# Patient Record
Sex: Female | Born: 1955 | Race: White | Hispanic: No | Smoking: Never smoker
Health system: Southern US, Community
[De-identification: ages and names within clinical notes are randomized; demographics above are authoritative.]

## PROBLEM LIST (undated history)

## (undated) DIAGNOSIS — R112 Nausea with vomiting, unspecified: Secondary | ICD-10-CM

## (undated) DIAGNOSIS — I10 Essential (primary) hypertension: Secondary | ICD-10-CM

## (undated) DIAGNOSIS — Z9889 Other specified postprocedural states: Secondary | ICD-10-CM

## (undated) HISTORY — DX: Essential (primary) hypertension: I10

## (undated) HISTORY — PX: ABDOMINAL HYSTERECTOMY: SHX81

## (undated) HISTORY — PX: SHOULDER SURGERY: SHX246

---

## 1980-07-08 HISTORY — PX: TUBAL LIGATION: SHX77

## 1997-10-06 ENCOUNTER — Other Ambulatory Visit: Admission: RE | Admit: 1997-10-06 | Discharge: 1997-10-06 | Payer: Self-pay | Admitting: Obstetrics and Gynecology

## 1998-10-11 ENCOUNTER — Other Ambulatory Visit: Admission: RE | Admit: 1998-10-11 | Discharge: 1998-10-11 | Payer: Self-pay | Admitting: Obstetrics and Gynecology

## 2000-02-01 ENCOUNTER — Other Ambulatory Visit: Admission: RE | Admit: 2000-02-01 | Discharge: 2000-02-01 | Payer: Self-pay | Admitting: Obstetrics and Gynecology

## 2002-06-15 ENCOUNTER — Other Ambulatory Visit: Admission: RE | Admit: 2002-06-15 | Discharge: 2002-06-15 | Payer: Self-pay | Admitting: Family Medicine

## 2006-06-06 ENCOUNTER — Encounter (INDEPENDENT_AMBULATORY_CARE_PROVIDER_SITE_OTHER): Payer: Self-pay | Admitting: Specialist

## 2006-06-06 ENCOUNTER — Ambulatory Visit (HOSPITAL_BASED_OUTPATIENT_CLINIC_OR_DEPARTMENT_OTHER): Admission: RE | Admit: 2006-06-06 | Discharge: 2006-06-06 | Payer: Self-pay | Admitting: Otolaryngology

## 2006-07-15 ENCOUNTER — Encounter: Admission: RE | Admit: 2006-07-15 | Discharge: 2006-07-15 | Payer: Self-pay | Admitting: Otolaryngology

## 2007-02-17 ENCOUNTER — Ambulatory Visit (HOSPITAL_COMMUNITY): Admission: RE | Admit: 2007-02-17 | Discharge: 2007-02-18 | Payer: Self-pay | Admitting: Orthopedic Surgery

## 2007-02-27 ENCOUNTER — Encounter: Admission: RE | Admit: 2007-02-27 | Discharge: 2007-04-29 | Payer: Self-pay | Admitting: Orthopedic Surgery

## 2010-02-06 ENCOUNTER — Encounter: Payer: Self-pay | Admitting: Cardiovascular Disease

## 2010-11-13 ENCOUNTER — Encounter: Payer: Self-pay | Admitting: Cardiovascular Disease

## 2010-11-20 NOTE — Op Note (Signed)
NAMEJACQUILYN, Dana Gray                ACCOUNT NO.:  [id number]   MEDICAL RECORD NO.:  [id number]          PATIENT TYPE:  OIB   LOCATION:  5727                         FACILITY:  [facility]   PHYSICIAN:  Burnard Bunting, M.D.    DATE OF BIRTH:  07-18-1955   DATE OF PROCEDURE:  02/17/2007  DATE OF DISCHARGE:  02/18/2007                               OPERATIVE REPORT   PREOPERATIVE DIAGNOSIS:  Left shoulder rotator cuff tear and bursitis.   POSTOPERATIVE DIAGNOSIS:  Left shoulder rotator cuff tear and bursitis.   PROCEDURE:  Left shoulder diagnostic arthroscopy with arthroscopic  subacromial decompression and mini open rotator cuff repair.   SURGEON:  Burnard Bunting, M.D.   ASSISTANT:  Jerolyn Shin. Tresa Res, M.D.   ANESTHESIA:  General endotracheal.   ESTIMATED BLOOD LOSS:  Minimal.   INDICATIONS:  Dana Gray is a 55 year old patient with a symptomatic  left shoulder rotator cuff tear who presents now for operative  management after failure of conservative management and because of her  young age.   OPERATIVE FINDINGS:  1. Examination under anesthesia range of motion 180 forward flexion,      external rotation 50 degrees, abduction is about 70.  She has      excellent shoulder stability with less than 1 cm sulcus sign.  2. Diagnostic arthroscopy:      a.     A 1 x 2 cm rotator cuff tear of the supraspinatus which was       minimally retracted.      b.     Intact biceps anchor.      c.     Stable glenohumeral articular surface.      d.     Significant subacromial bursitis.   PROCEDURE IN DETAIL:  The patient was brought to the operating room  where general endotracheal anesthesia was induced.  Appropriate  antibiotics were administered.  The patient was placed in the beach  chair position with head in neutral position.  Left arm, hand and  shoulder were prepped with DuraPrep solution and draped in sterile  manner.  Topographic anatomy of the shoulder was identified including  the  posterolateral and anterior margins of the acromion as well as the  coracoid process.  A solution of saline and epinephrine was injected  into the subacromial space.  A posterior portal was created 2 cm medial  and inferior to the posterolateral margin of the acromion.  Diagnostic  arthroscopy was performed.  Anterior portal was created for  visualization.  Biceps anchor was intact.  Glenohumeral articular  surface was intact.  The rotator cuff tear was identified on the leading  edge of supraspinatus.  Subacromial bursitis was present.  A lateral  portal was created.  Subacromial decompression was performed with relief  but not resection of the same ligament.   At this time anterior and posterior portals were closed with 3-0 nylon  suture.  The lateral incision was extended distally and proximally for a  distance of 4 cm.  A deltoid split was made and measured a distance of 4  cm  from the inferior margin of the acromion.  A stay suture was placed.  Self-retaining retractor was placed.  Rotator cuff tear was identified,  mobilized and secured to a prepared native footprint on the greater  tuberosity using 2 corkscrew anchors and 2 push-locks.  A watertight  repair was achieved.  The arm was taken through range of motion and  found to have no impingement.  Incision was irrigated.  Deltoid was  closed using 0 Vicryl suture.  Skin was closed using interrupted 2-0  Vicryl suture, running 3-0 polypropylene.  Patient tolerated the  procedure well without immediate complications.  A bulky dressing and  shoulder immobilizer was placed.  The assistance of Dr. Tresa Res was  required at all times during the case for retraction of neurovascular  structures.  Assistance was a medical necessity for this case.      Burnard Bunting, M.D.  Electronically Signed     GSD/MEDQ  D:  03/03/2007  T:  03/04/2007  Job:  [id number]

## 2011-03-06 ENCOUNTER — Encounter: Payer: Self-pay | Admitting: *Deleted

## 2011-03-07 ENCOUNTER — Encounter: Payer: Self-pay | Admitting: Cardiovascular Disease

## 2011-03-07 ENCOUNTER — Ambulatory Visit (INDEPENDENT_AMBULATORY_CARE_PROVIDER_SITE_OTHER): Payer: BC Managed Care – PPO | Admitting: Cardiovascular Disease

## 2011-03-07 DIAGNOSIS — I10 Essential (primary) hypertension: Secondary | ICD-10-CM | POA: Insufficient documentation

## 2011-03-07 DIAGNOSIS — R079 Chest pain, unspecified: Secondary | ICD-10-CM | POA: Insufficient documentation

## 2011-03-07 LAB — BASIC METABOLIC PANEL
BUN: 12 mg/dL (ref 6–23)
CO2: 28 mEq/L (ref 19–32)
Calcium: 9.2 mg/dL (ref 8.4–10.5)
Chloride: 104 mEq/L (ref 96–112)
Creatinine, Ser: 0.7 mg/dL (ref 0.4–1.2)
GFR: 93.05 mL/min (ref >60.00)
Glucose, Bld: 97 mg/dL (ref 70–99)
Potassium: 4.2 mEq/L (ref 3.5–5.1)
Sodium: 140 mEq/L (ref 135–145)

## 2011-03-07 LAB — SEDIMENTATION RATE: Sed Rate: 22 mm/hr (ref 0–22)

## 2011-03-07 MED ORDER — TRAMADOL HCL 50 MG PO TBDP: 1.0000 | ORAL_TABLET | Freq: Every day | ORAL | Status: DC

## 2011-03-07 NOTE — Patient Instructions (Signed)
Your physician recommends that you schedule a follow-up appointment in: AFTER TESTING  Your physician recommends that you return for lab work in: TODAY  START TRAMADOL/ULTRAM 50 MG ONCE DAILY

## 2011-03-07 NOTE — Assessment & Plan Note (Signed)
Well controlled.  Continue current medications and low sodium Dash type diet.    

## 2011-03-07 NOTE — Assessment & Plan Note (Signed)
Atypical Trial of Tramadol.  ESR/ANA/RF  Randomize in [facility] to CT or stress echo

## 2011-03-07 NOTE — Progress Notes (Signed)
55 yo referred by Dr Christell Constant for SSCP.  Normal ETT last October.  Pain persists.  Not helped by gi meds.  Not always exertional.  Can last seconds or minutes.  Usually sharp and left sided.  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  No associated palpitations, dyspnea, diaphoresis.  Has arthritis in hips but no where else and no connective tissue disease.  Pain is not pleuritic.  No wheezing or respitory symptoms.    Good patient to be randomized in [facility] to CT vs stress echo.  Patient willing to have Cr checked.  CT would evaluate the entire chest as her pain seems muscular.  Will give her a trial of Tramadol for 3 weeks also   ROS: Denies fever, malais, weight loss, blurry vision, decreased visual acuity, cough, sputum, SOB, hemoptysis, pleuritic pain, palpitaitons, heartburn, abdominal pain, melena, lower extremity edema, claudication, or rash.  All other systems reviewed and negative   General: Affect appropriate Healthy:  appears stated age HEENT: normal Neck supple with no adenopathy JVP normal no bruits no thyromegaly Lungs clear with no wheezing and good diaphragmatic motion Heart:  S1/S2 no murmur,rub, gallop or click PMI normal Abdomen: benighn, BS positve, no tenderness, no AAA no bruit.  No HSM or HJR Distal pulses intact with no bruits No edema Neuro non-focal Skin warm and dry No muscular weakness  Medications Current Outpatient Prescriptions  Medication Sig Dispense Refill  . cetirizine (ZYRTEC) 10 MG tablet Take 10 mg by mouth daily.        Marland Kitchen lisinopril (PRINIVIL,ZESTRIL) 10 MG tablet Take 10 mg by mouth daily.        Marland Kitchen omeprazole (PRILOSEC) 20 MG capsule Take 20 mg by mouth daily.          Allergies Review of patient's allergies indicates not on file.  Family History: No family history on file.  Social History: History   Social History  . Marital Status: Married    Spouse Name: N/A    Number of Children: N/A  . Years of Education: N/A    Occupational History  . Not on file.   Social History Main Topics  . Smoking status: Never Smoker   . Smokeless tobacco: Not on file  . Alcohol Use: Not on file  . Drug Use: Not on file  . Sexually Active: Not on file   Other Topics Concern  . Not on file   Social History Narrative  . No narrative on file    Electrocardiogram:  NSR minimal voltage for LVHrate 77  Assessment and Plan

## 2011-03-08 ENCOUNTER — Telehealth: Payer: Self-pay | Admitting: Cardiovascular Disease

## 2011-03-08 ENCOUNTER — Telehealth: Payer: Self-pay | Admitting: *Deleted

## 2011-03-08 NOTE — Telephone Encounter (Signed)
I spoke to patient and told her she was randomized to Coronary CTA for the [facility] study.I let her know BMET lab was normal.I told patient she would receive phone call form Merita Norton in the office with the date of CTA. Patient stated Tuesday is the best day with her work schedule. Patient does not want to be scheduled on Sept 13 and 14 due to new grand baby due

## 2011-03-08 NOTE — Telephone Encounter (Signed)
Spoke with patient and let know the medication is Tramadol

## 2011-03-08 NOTE — Telephone Encounter (Signed)
Pt checking on med that was to be called in yesterday, not sure right med was called in

## 2011-03-12 ENCOUNTER — Other Ambulatory Visit: Payer: Self-pay | Admitting: *Deleted

## 2011-03-13 ENCOUNTER — Other Ambulatory Visit (HOSPITAL_COMMUNITY): Payer: Self-pay | Admitting: Cardiovascular Disease

## 2011-03-13 ENCOUNTER — Encounter: Payer: Self-pay | Admitting: Cardiovascular Disease

## 2011-03-20 ENCOUNTER — Ambulatory Visit (HOSPITAL_COMMUNITY)
Admission: RE | Admit: 2011-03-20 | Discharge: 2011-03-20 | Disposition: A | Discharge status: Home / Self Care | Payer: Self-pay | Source: Ambulatory Visit | Attending: Cardiovascular Disease | Admitting: Cardiovascular Disease

## 2011-03-20 DIAGNOSIS — R079 Chest pain, unspecified: Secondary | ICD-10-CM | POA: Insufficient documentation

## 2011-03-20 DIAGNOSIS — R0989 Other specified symptoms and signs involving the circulatory and respiratory systems: Secondary | ICD-10-CM

## 2011-03-20 MED ORDER — IOHEXOL 350 MG/ML SOLN
80.0000 mL | Freq: Once | INTRAVENOUS | Status: AC | PRN
  Administered 2011-03-20: 80 mL via INTRAVENOUS

## 2011-04-22 LAB — CBC
HCT: 38.5
Hemoglobin: 13
MCHC: 33.7
MCV: 85.5
Platelets: 364
RBC: 4.5
RDW: 13.4
WBC: 7.4

## 2011-04-22 LAB — BASIC METABOLIC PANEL
BUN: 9
CO2: 27
Calcium: 9.5
Chloride: 103
Creatinine, Ser: 0.7
GFR calc Af Amer: >60
GFR calc non Af Amer: >60
Glucose, Bld: 88
Potassium: 4.8
Sodium: 139

## 2012-11-20 ENCOUNTER — Ambulatory Visit (INDEPENDENT_AMBULATORY_CARE_PROVIDER_SITE_OTHER): Payer: BC Managed Care – PPO | Admitting: Nurse Practitioner

## 2012-11-20 ENCOUNTER — Encounter: Payer: Self-pay | Admitting: Nurse Practitioner

## 2012-11-20 VITALS — BP 145/84 | HR 65 | Temp 97.6°F | Ht 63.5 in | Wt 167.0 lb

## 2012-11-20 DIAGNOSIS — G4700 Insomnia, unspecified: Secondary | ICD-10-CM | POA: Insufficient documentation

## 2012-11-20 DIAGNOSIS — E785 Hyperlipidemia, unspecified: Secondary | ICD-10-CM

## 2012-11-20 DIAGNOSIS — Z0000 Encounter for general adult medical examination without abnormal findings: Secondary | ICD-10-CM

## 2012-11-20 DIAGNOSIS — M810 Age-related osteoporosis without current pathological fracture: Secondary | ICD-10-CM | POA: Insufficient documentation

## 2012-11-20 DIAGNOSIS — R5381 Other malaise: Secondary | ICD-10-CM

## 2012-11-20 LAB — COMPLETE METABOLIC PANEL WITH GFR
ALT: 18 U/L (ref 0–35)
AST: 24 U/L (ref 0–37)
Albumin: 4.8 g/dL (ref 3.5–5.2)
Alkaline Phosphatase: 88 U/L (ref 39–117)
BUN: 17 mg/dL (ref 6–23)
CO2: 29 mEq/L (ref 19–32)
Calcium: 9.6 mg/dL (ref 8.4–10.5)
Chloride: 102 mEq/L (ref 96–112)
Creat: 0.79 mg/dL (ref 0.50–1.10)
GFR, Est African American: >89 mL/min
GFR, Est Non African American: 82 mL/min
Glucose, Bld: 90 mg/dL (ref 70–99)
Potassium: 4.6 mEq/L (ref 3.5–5.3)
Sodium: 138 mEq/L (ref 135–145)
Total Bilirubin: 0.4 mg/dL (ref 0.3–1.2)
Total Protein: 7.5 g/dL (ref 6.0–8.3)

## 2012-11-20 LAB — POCT UA - MICROSCOPIC ONLY
Casts, Ur, LPF, POC: NEGATIVE
Crystals, Ur, HPF, POC: NEGATIVE
Yeast, UA: NEGATIVE

## 2012-11-20 LAB — ANEMIA PANEL 7
%SAT: 22 % (ref 20–55)
ABS Retic: 32.2 10*3/uL (ref 19.0–186.0)
Ferritin: 149 ng/mL (ref 10–291)
Folate: 15 ng/mL
HCT: 38.6 % (ref 36.0–46.0)
Hemoglobin: 12.8 g/dL (ref 12.0–15.0)
Iron: 72 ug/dL (ref 42–145)
MCH: 27.8 pg (ref 26.0–34.0)
MCHC: 33.2 g/dL (ref 30.0–36.0)
MCV: 83.9 fL (ref 78.0–100.0)
Platelets: 351 10*3/uL (ref 150–400)
RBC.: 4.6 MIL/uL (ref 3.87–5.11)
RBC: 4.6 MIL/uL (ref 3.87–5.11)
RDW: 13.8 % (ref 11.5–15.5)
Retic Ct Pct: 0.7 % (ref 0.4–2.3)
TIBC: 331 ug/dL (ref 250–470)
UIBC: 259 ug/dL (ref 125–400)
Vitamin B-12: 471 pg/mL (ref 211–911)
WBC: 8.7 10*3/uL (ref 4.0–10.5)

## 2012-11-20 LAB — POCT URINALYSIS DIPSTICK
Bilirubin, UA: NEGATIVE
Blood, UA: NEGATIVE
Glucose, UA: NEGATIVE
Ketones, UA: NEGATIVE
Leukocytes, UA: NEGATIVE
Nitrite, UA: NEGATIVE
Protein, UA: NEGATIVE
Spec Grav, UA: 1.015
Urobilinogen, UA: NEGATIVE
pH, UA: 5

## 2012-11-20 LAB — THYROID PANEL WITH TSH
Free Thyroxine Index: 3 (ref 1.0–3.9)
T3 Uptake: 31.8 % (ref 22.5–37.0)
T4, Total: 9.3 ug/dL (ref 5.0–12.5)
TSH: 2.538 u[IU]/mL (ref 0.350–4.500)

## 2012-11-20 MED ORDER — LISINOPRIL 20 MG PO TABS: 20.0000 mg | ORAL_TABLET | Freq: Every day | ORAL | Status: DC

## 2012-11-20 NOTE — Patient Instructions (Addendum)

## 2012-11-20 NOTE — Progress Notes (Signed)
  Subjective:    Patient ID: Dana Gray, female    DOB: 1955/08/13, 57 y.o.   MRN: [id number]  Hypertension This is a chronic problem. The current episode started more than 1 year ago. The problem is unchanged. The problem is uncontrolled (usually in 140's systolic). Pertinent negatives include no chest pain, headaches, neck pain, palpitations, peripheral edema, shortness of breath or sweats. There are no associated agents to hypertension. Risk factors for coronary artery disease include post-menopausal state and dyslipidemia. Past treatments include ACE inhibitors. There are no compliance problems.   Hyperlipidemia This is a chronic problem. The current episode started more than 1 year ago. The problem is controlled. Recent lipid tests were reviewed and are normal. Associated symptoms include myalgias. Pertinent negatives include no chest pain or shortness of breath. Treatments tried: patient never statred on lipitor as Rx. The current treatment provides moderate improvement of lipids. Risk factors for coronary artery disease include hypertension and post-menopausal.  insomnia Intermittent- Doesn't want to take meds for this. Fatigue Patient says she just doesn't have the energy and stamina she use to have- feels achy all over.   Review of Systems  Constitutional: Positive for fatigue.  HENT: Negative for neck pain.   Respiratory: Negative for shortness of breath.   Cardiovascular: Negative for chest pain and palpitations.  Musculoskeletal: Positive for myalgias and arthralgias.  Neurological: Negative for headaches.  Hematological: Negative.   Psychiatric/Behavioral: Negative.        Objective:   Physical Exam  Constitutional: She is oriented to person, place, and time. She appears well-developed and well-nourished.  HENT:  Nose: Nose normal.  Mouth/Throat: Oropharynx is clear and moist.  Eyes: EOM are normal.  Neck: Trachea normal, normal range of motion and full passive range of  motion without pain. Neck supple. No JVD present. Carotid bruit is not present. No thyromegaly present.  Cardiovascular: Normal rate, regular rhythm, normal heart sounds and intact distal pulses.  Exam reveals no gallop and no friction rub.   No murmur heard. Pulmonary/Chest: Effort normal and breath sounds normal.  Abdominal: Soft. Bowel sounds are normal. She exhibits no distension and no mass. There is no tenderness.  Musculoskeletal: Normal range of motion.  Lymphadenopathy:    She has no cervical adenopathy.  Neurological: She is alert and oriented to person, place, and time. She has normal reflexes.  Skin: Skin is warm and dry.  Psychiatric: She has a normal mood and affect. Her behavior is normal. Judgment and thought content normal.   BP 145/84  Pulse 65  Temp(Src) 97.6 F (36.4 C) (Oral)  Ht 5' 3.5" (1.613 m)  Wt 167 lb (75.751 kg)  BMI 29.12 kg/m2        Assessment & Plan:  1. Hyperlipidemia Low fat diet and exercise - lisinopril (PRINIVIL,ZESTRIL) 20 MG tablet; Take 1 tablet (20 mg total) by mouth daily.  Dispense: 90 tablet; Refill: 3  2. Insomnia Bedtime ritual  3. Osteoporosis Will schedule Dexa  4. Annual physical exam  - COMPLETE METABOLIC PANEL WITH GFR - NMR Lipoprofile with Lipids - Thyroid Panel With TSH  5. Other malaise and fatigue Labs pending - Anemia panel 7 - Vitamin B12  Mary-Margaret Daphine Deutscher, FNP

## 2012-11-25 ENCOUNTER — Ambulatory Visit (INDEPENDENT_AMBULATORY_CARE_PROVIDER_SITE_OTHER): Payer: BC Managed Care – PPO

## 2012-11-25 ENCOUNTER — Ambulatory Visit: Payer: BC Managed Care – PPO

## 2012-11-25 ENCOUNTER — Ambulatory Visit (INDEPENDENT_AMBULATORY_CARE_PROVIDER_SITE_OTHER): Payer: BC Managed Care – PPO | Admitting: Pharmacist

## 2012-11-25 ENCOUNTER — Other Ambulatory Visit: Payer: BC Managed Care – PPO

## 2012-11-25 VITALS — Ht 63.0 in | Wt 159.0 lb

## 2012-11-25 DIAGNOSIS — M810 Age-related osteoporosis without current pathological fracture: Secondary | ICD-10-CM

## 2012-11-25 DIAGNOSIS — Z0000 Encounter for general adult medical examination without abnormal findings: Secondary | ICD-10-CM

## 2012-11-25 NOTE — Patient Instructions (Signed)

## 2012-11-25 NOTE — Progress Notes (Signed)
Patient ID: Dana Gray, female   DOB: 05/06/1956, 57 y.o.   MRN: [id number] [facility] Current Height: Height: 5\' 3"  (160 cm)      Max Lifetime Height:  5\' 3"  Current Weight: Weight: 159 lb (72.122 kg)       Ethnicity:Caucasian   HPI: Does pt already have a diagnosis of:  Osteoporosis?  Yes  Back Pain?  No       Kyphosis?  No Prior fracture?  No Med(s) for Osteoporosis/Osteopenia:  none Med(s) previously tried for Osteoporosis/Osteopenia:  actonel and fosamax - made her joints/bones ache and jaw pain                                                             PMH: Age at menopause:  Surgical at 57yo Hysterectomy?  Yes Oophorectomy?  Yes HRT? Yes - Former.  Type/duration: premarin - for about 10 years Steroid Use?  No Thyroid med?  No History of cancer?  No History of digestive disorders (ie Crohn's)?  No Current or previous eating disorders?  No Last Vitamin D Result:  24 (11/2010) Last GFR Result:  77 (02/2012) - lab drawn last week but results still pending   FH/SH: Family history of osteoporosis?  Yes - sister (questionable mother) Parent with history of hip fracture?  No Family history of breast cancer?  No Exercise?  No Denies soda intake Smoking?  No Alcohol?  No    Calcium Assessment Calcium Intake  # of servings/day  Calcium mg  Milk (8 oz) 1  x  300  = 300mg   Yogurt (4 oz) 1 x  200 = 200mg   Cheese (1 oz) 0 x  200 = 0  Other Calcium sources   250mg   Ca supplement 0 = 0   Estimated calcium intake per day 750mg     DEXA Results Date of Test T-Score for AP Spine L1-L4 T-Score for Total Left Hip   T-Score of neck of left hip T-Score for Total Right Hip T-Score of neck of right hip  11/25/2012 +0.5 -1.2 -2.1 -1.3 -2.6  10/04/2009 +0.6 -1.0 -1.9 -1.2 -2.7  09/09/2007 +0.3 -0.9 -2.0 -1.3 -2.4  05/06/2005 +0.4 -1.0 -2.4 - -    Assessment: Osteoporosis with decreased BMD at neck of right hip/femur.  History of intolerance to oral  bisphosphonates  Recommendations: 1.  Start  Prolia 60mg /mL inject q 6 months - will look into insurance coverage and then order medication for administration in our office. 2.  recommend calcium 1200mg  daily either through supplementation   or diet.   3.  recommend weight bearing exercise - 30 minutes at least 4 days   per week.   4.  Counseled and educated about fall risk and prevention.  Recheck DEXA:  2 years  Time spent counseling patient:  30 minutes

## 2012-11-26 LAB — NMR LIPOPROFILE WITH LIPIDS
Cholesterol, Total: 189 mg/dL (ref <200)
HDL Particle Number: 35.6 umol/L (ref >=30.5)
HDL Size: 10.1 nm (ref >=9.2)
HDL-C: 71 mg/dL (ref >=40)
LDL (calc): 108 mg/dL — ABNORMAL HIGH (ref <100)
LDL Particle Number: 1104 nmol/L — ABNORMAL HIGH (ref <1000)
LDL Size: 21.2 nm (ref >20.5)
LP-IR Score: <25 (ref <=45)
Large HDL-P: 14.1 umol/L (ref >=4.8)
Large VLDL-P: 1 nmol/L (ref <=2.7)
Small LDL Particle Number: <90 nmol/L (ref <=527)
Triglycerides: 48 mg/dL (ref <150)
VLDL Size: 42 nm (ref <=46.6)

## 2012-12-01 ENCOUNTER — Telehealth: Payer: Self-pay | Admitting: *Deleted

## 2012-12-01 NOTE — Telephone Encounter (Signed)
Called patient with lab results and she states that she left a paper to be filled out for insurance. Wants to know if its ready

## 2012-12-04 NOTE — Telephone Encounter (Signed)
lmtcb 12/03/12 and 12/04/12

## 2012-12-30 ENCOUNTER — Other Ambulatory Visit: Payer: Self-pay | Admitting: Pharmacist

## 2012-12-30 DIAGNOSIS — M810 Age-related osteoporosis without current pathological fracture: Secondary | ICD-10-CM

## 2013-01-01 ENCOUNTER — Other Ambulatory Visit: Payer: BC Managed Care – PPO

## 2013-01-01 DIAGNOSIS — M810 Age-related osteoporosis without current pathological fracture: Secondary | ICD-10-CM

## 2013-01-02 LAB — VITAMIN D 25 HYDROXY (VIT D DEFICIENCY, FRACTURES): Vit D, 25-Hydroxy: 23 ng/mL — ABNORMAL LOW (ref 30–89)

## 2013-01-05 ENCOUNTER — Telehealth: Payer: Self-pay | Admitting: Pharmacist

## 2013-01-05 MED ORDER — VITAMIN D 1000 UNITS PO CAPS: 1000.0000 [IU] | ORAL_CAPSULE | Freq: Every day | ORAL | Status: DC

## 2013-01-05 NOTE — Telephone Encounter (Signed)
Vitamin D level was low at 23.   Patient instructed to start OTC viamin D3 1000 IU daily Recheck vitamin D level in 3 months.

## 2013-01-14 NOTE — Progress Notes (Signed)
Patient came in for labs only.

## 2013-05-13 ENCOUNTER — Other Ambulatory Visit: Payer: Self-pay

## 2013-10-21 ENCOUNTER — Ambulatory Visit (INDEPENDENT_AMBULATORY_CARE_PROVIDER_SITE_OTHER): Payer: BC Managed Care – PPO | Admitting: Family Medicine

## 2013-10-21 ENCOUNTER — Encounter: Payer: Self-pay | Admitting: Family Medicine

## 2013-10-21 VITALS — BP 149/81 | HR 85 | Temp 97.7°F | Ht 63.5 in | Wt 154.0 lb

## 2013-10-21 DIAGNOSIS — L237 Allergic contact dermatitis due to plants, except food: Secondary | ICD-10-CM

## 2013-10-21 DIAGNOSIS — L255 Unspecified contact dermatitis due to plants, except food: Secondary | ICD-10-CM

## 2013-10-21 DIAGNOSIS — I10 Essential (primary) hypertension: Secondary | ICD-10-CM

## 2013-10-21 DIAGNOSIS — E785 Hyperlipidemia, unspecified: Secondary | ICD-10-CM

## 2013-10-21 DIAGNOSIS — M810 Age-related osteoporosis without current pathological fracture: Secondary | ICD-10-CM

## 2013-10-21 MED ORDER — METHYLPREDNISOLONE ACETATE 40 MG/ML IJ SUSP
80.0000 mg | Freq: Once | INTRAMUSCULAR | Status: AC
  Administered 2013-10-21: 80 mg via INTRAMUSCULAR

## 2013-10-21 MED ORDER — METHYLPREDNISOLONE ACETATE 80 MG/ML IJ SUSP: 80.0000 mg | Freq: Once | INTRAMUSCULAR | Status: DC

## 2013-10-21 MED ORDER — PREDNISONE 20 MG PO TABS: 60.0000 mg | ORAL_TABLET | Freq: Every day | ORAL | Status: DC

## 2013-10-21 NOTE — Progress Notes (Signed)
Patient ID: Dana Gray, female   DOB: 15-May-1956, 58 y.o.   MRN: [id number] SUBJECTIVE: CC: Chief Complaint  Patient presents with  . Acute Visit    poisn oak on arms    HPI: Doing yard work over the weekend and broke out the last 2 to 3 days. Thinks it is really poison Ivy.    Past Medical History  Diagnosis Date  . Chest pain   . HTN (hypertension)    Past Surgical History  Procedure Laterality Date  . Shoulder surgery    . Abdominal hysterectomy     History   Social History  . Marital Status: Married    Spouse Name: N/A    Number of Children: N/A  . Years of Education: N/A   Occupational History  . Not on file.   Social History Main Topics  . Smoking status: Never Smoker   . Smokeless tobacco: Not on file  . Alcohol Use: Not on file  . Drug Use: Not on file  . Sexual Activity: Not on file   Other Topics Concern  . Not on file   Social History Narrative  . No narrative on file   Family History  Problem Relation Age of Onset  . Hypertension Mother   . Arthritis Mother   . Emphysema Father   . COPD Father   . Heart failure Father    Current Outpatient Prescriptions on File Prior to Visit  Medication Sig Dispense Refill  . cetirizine (ZYRTEC) 10 MG tablet Take 10 mg by mouth daily.        . Cholecalciferol (VITAMIN D) 1000 UNITS capsule Take 1 capsule (1,000 Units total) by mouth daily.  1 capsule    . lisinopril (PRINIVIL,ZESTRIL) 20 MG tablet Take 1 tablet (20 mg total) by mouth daily.  90 tablet  3   No current facility-administered medications on file prior to visit.   No Known Allergies  There is no immunization history on file for this patient. Prior to Admission medications   Medication Sig Start Date End Date Taking? Authorizing Provider  cetirizine (ZYRTEC) 10 MG tablet Take 10 mg by mouth daily.     Yes Historical Provider, MD  Cholecalciferol (VITAMIN D) 1000 UNITS capsule Take 1 capsule (1,000 Units total) by mouth daily. 01/05/13  Yes  Tammy Eckard, PHARMD  lisinopril (PRINIVIL,ZESTRIL) 20 MG tablet Take 1 tablet (20 mg total) by mouth daily. 11/20/12  Yes Mary-Margaret Daphine Deutscher, FNP     ROS: As above in the HPI. All other systems are stable or negative.  OBJECTIVE: APPEARANCE:  Patient in no acute distress.The patient appeared well nourished and normally developed. Acyanotic. Waist: VITAL SIGNS:BP 149/81  Pulse 85  Temp(Src) 97.7 F (36.5 C) (Oral)  Ht 5' 3.5" (1.613 m)  Wt 154 lb (69.854 kg)  BMI 26.85 kg/m2   SKIN: warm and  Dry. Rash red streaks maculopapular eczematous with some mild weeping. modwerate eruption on both upper extremities and the right thigh.   HEAD and Neck: without JVD, Head and scalp: normal Eyes:No scleral icterus. Fundi normal, eye movements normal. Ears: Auricle normal, canal normal, Tympanic membranes normal, insufflation normal. Nose: normal Throat: normal Neck & thyroid: normal  CHEST & LUNGS: Chest wall: normal Lungs: Clear  CVS: Reveals the PMI to be normally located. Regular rhythm, First and Second Heart sounds are normal,  absence of murmurs, rubs or gallops. Peripheral vasculature: Radial pulses: normal Dorsal pedis pulses: normal Posterior pulses: normal  ABDOMEN:  Appearance: normal Benign,  no organomegaly, no masses, no Abdominal Aortic enlargement. No Guarding , no rebound. No Bruits. Bowel sounds: normal  RECTAL: N/A GU: N/A  EXTREMETIES: nonedematous.  MUSCULOSKELETAL:  Spine: normal Joints: intact  NEUROLOGIC: oriented to time,place and person; nonfocal. Strength is normal Sensory is normal Reflexes are normal Cranial Nerves are normal.  ASSESSMENT:  Poison ivy dermatitis - Plan: predniSONE (DELTASONE) 20 MG tablet, methylPREDNISolone acetate (DEPO-MEDROL) injection 80 mg, DISCONTINUED: methylPREDNISolone acetate (DEPO-MEDROL) injection 80 mg  Hyperlipidemia  HTN (hypertension)  Osteoporosis  PLAN: Discussed avoidance. Skin  care. No orders of the defined types were placed in this encounter.   Meds ordered this encounter  Medications  . predniSONE (DELTASONE) 20 MG tablet    Sig: Take 3 tablets (60 mg total) by mouth daily with breakfast. Daily for 3 days, then 2 tabs daily for 2 days, then 1 tab daily for 2 days, then 1/2 tab daily for 2 days    Dispense:  16 tablet    Refill:  0  . DISCONTD: methylPREDNISolone acetate (DEPO-MEDROL) injection 80 mg    Sig:   . methylPREDNISolone acetate (DEPO-MEDROL) injection 80 mg    Sig:    Medications Discontinued During This Encounter  Medication Reason  . methylPREDNISolone acetate (DEPO-MEDROL) injection 80 mg    Return if symptoms worsen or fail to improve.  Adi Doro P. Modesto Charon, M.D.

## 2013-10-21 NOTE — Progress Notes (Signed)
Tolerated depo medrol im injection without difficulty

## 2013-10-21 NOTE — Patient Instructions (Signed)
Poison Ivy Poison ivy is a inflammation of the skin (contact dermatitis) caused by touching the allergens on the leaves of the ivy plant following previous exposure to the plant. The rash usually appears 48 hours after exposure. The rash is usually bumps (papules) or blisters (vesicles) in a linear pattern. Depending on your own sensitivity, the rash may simply cause redness and itching, or it may also progress to blisters which may break open. These must be well cared for to prevent secondary bacterial (germ) infection, followed by scarring. Keep any open areas dry, clean, dressed, and covered with an antibacterial ointment if needed. The eyes may also get puffy. The puffiness is worst in the morning and gets better as the day progresses. This dermatitis usually heals without scarring, within 2 to 3 weeks without treatment. HOME CARE INSTRUCTIONS  Thoroughly wash with soap and water as soon as you have been exposed to poison ivy. You have about one half hour to remove the plant resin before it will cause the rash. This washing will destroy the oil or antigen on the skin that is causing, or will cause, the rash. Be sure to wash under your fingernails as any plant resin there will continue to spread the rash. Do not rub skin vigorously when washing affected area. Poison ivy cannot spread if no oil from the plant remains on your body. A rash that has progressed to weeping sores will not spread the rash unless you have not washed thoroughly. It is also important to wash any clothes you have been wearing as these may carry active allergens. The rash will return if you wear the unwashed clothing, even several days later. Avoidance of the plant in the future is the best measure. Poison ivy plant can be recognized by the number of leaves. Generally, poison ivy has three leaves with flowering branches on a single stem. Diphenhydramine may be purchased over the counter and used as needed for itching. Do not drive with  this medication if it makes you drowsy.Ask your caregiver about medication for children. SEEK MEDICAL CARE IF:  Open sores develop.  Redness spreads beyond area of rash.  You notice purulent (pus-like) discharge.  You have increased pain.  Other signs of infection develop (such as fever). Document Released: 06/21/2000 Document Revised: 09/16/2011 Document Reviewed: 05/10/2009 [employer] Patient Information 2014 [employer].  

## 2013-12-24 ENCOUNTER — Telehealth: Payer: Self-pay | Admitting: Nurse Practitioner

## 2013-12-24 DIAGNOSIS — E785 Hyperlipidemia, unspecified: Secondary | ICD-10-CM

## 2013-12-24 MED ORDER — LISINOPRIL 20 MG PO TABS
20.0000 mg | ORAL_TABLET | Freq: Every day | ORAL | Status: DC
Start: 2013-12-24 — End: 2014-02-15

## 2013-12-24 NOTE — Telephone Encounter (Signed)
done

## 2014-01-21 ENCOUNTER — Telehealth: Payer: Self-pay | Admitting: Nurse Practitioner

## 2014-01-21 NOTE — Telephone Encounter (Signed)
appt scheduled

## 2014-01-24 ENCOUNTER — Encounter: Payer: Self-pay | Admitting: Nurse Practitioner

## 2014-01-24 ENCOUNTER — Ambulatory Visit (INDEPENDENT_AMBULATORY_CARE_PROVIDER_SITE_OTHER): Payer: BC Managed Care – PPO

## 2014-01-24 ENCOUNTER — Ambulatory Visit (INDEPENDENT_AMBULATORY_CARE_PROVIDER_SITE_OTHER): Payer: BC Managed Care – PPO | Admitting: Nurse Practitioner

## 2014-01-24 VITALS — BP 140/72 | HR 76 | Temp 97.9°F | Ht 63.5 in | Wt 153.0 lb

## 2014-01-24 DIAGNOSIS — M545 Low back pain, unspecified: Secondary | ICD-10-CM

## 2014-01-24 DIAGNOSIS — M25552 Pain in left hip: Secondary | ICD-10-CM

## 2014-01-24 DIAGNOSIS — M25559 Pain in unspecified hip: Secondary | ICD-10-CM

## 2014-01-24 MED ORDER — METHYLPREDNISOLONE ACETATE 80 MG/ML IJ SUSP
80.0000 mg | Freq: Once | INTRAMUSCULAR | Status: AC
  Administered 2014-01-24: 80 mg via INTRAMUSCULAR

## 2014-01-24 MED ORDER — MELOXICAM 15 MG PO TABS: 15.0000 mg | ORAL_TABLET | Freq: Every day | ORAL | Status: DC

## 2014-01-24 NOTE — Patient Instructions (Signed)

## 2014-01-24 NOTE — Progress Notes (Signed)
Subjective:    Patient ID: Dana Gray, female    DOB: [birth year], 58 y.o.   MRN: [id number]  HPI Patient in c/o left hip pian- has been hurting for a long time when she walks a lot but now it hurts to sit or lay on it. Pain radiates to .left butt cheek. Rates pain 4-9/10- patient has been taking aleve at night but is not helping.    Review of Systems  Respiratory: Negative.   Cardiovascular: Negative.   Musculoskeletal: Positive for arthralgias.  Neurological: Negative.   Psychiatric/Behavioral: Negative.   All other systems reviewed and are negative.      Objective:   Physical Exam  Constitutional: She is oriented to person, place, and time. She appears well-developed and well-nourished.  Cardiovascular: Normal rate and normal heart sounds.   Pulmonary/Chest: Effort normal and breath sounds normal.  Musculoskeletal:  No point tenderness on palpation of left hip FROM without pain Motor strength and sensation distally ontact  Neurological: She is alert and oriented to person, place, and time.  Skin: Skin is warm and dry.  Psychiatric: She has a normal mood and affect. Her behavior is normal. Judgment and thought content normal.   BP 140/72  Pulse 76  Temp(Src) 97.9 F (36.6 C) (Oral)  Ht 5' 3.5" (1.613 m)  Wt 153 lb (69.4 kg)  BMI 26.67 kg/m2  Lumbar x ray- joint space narrowing L5-S1-Preliminary reading by Paulene Floor, FNP  [facility] Left hip-- normal-Preliminary reading by Paulene Floor, FNP  [facility]      Assessment & Plan:   1. Left low back pain, with sciatica presence unspecified   2. Left hip pain    Meds ordered this encounter  Medications  . methylPREDNISolone acetate (DEPO-MEDROL) injection 80 mg    Sig:   . meloxicam (MOBIC) 15 MG tablet    Sig: Take 1 tablet (15 mg total) by mouth daily.    Dispense:  30 tablet    Refill:  3    Order Specific Question:  Supervising Provider    Answer:  Ernestina Penna [[id number]]   Moist heat No heavy lifting Rest Back  strengthening exercsies RTO prn  Dana Daphine Deutscher, FNP

## 2014-02-15 ENCOUNTER — Ambulatory Visit (INDEPENDENT_AMBULATORY_CARE_PROVIDER_SITE_OTHER): Payer: BC Managed Care – PPO

## 2014-02-15 ENCOUNTER — Ambulatory Visit (INDEPENDENT_AMBULATORY_CARE_PROVIDER_SITE_OTHER): Payer: BC Managed Care – PPO | Admitting: Nurse Practitioner

## 2014-02-15 ENCOUNTER — Encounter: Payer: Self-pay | Admitting: Nurse Practitioner

## 2014-02-15 VITALS — BP 133/82 | HR 61 | Temp 97.8°F | Ht 63.5 in | Wt 153.2 lb

## 2014-02-15 DIAGNOSIS — I10 Essential (primary) hypertension: Secondary | ICD-10-CM

## 2014-02-15 DIAGNOSIS — Z0000 Encounter for general adult medical examination without abnormal findings: Secondary | ICD-10-CM

## 2014-02-15 DIAGNOSIS — G4700 Insomnia, unspecified: Secondary | ICD-10-CM

## 2014-02-15 DIAGNOSIS — Z23 Encounter for immunization: Secondary | ICD-10-CM

## 2014-02-15 DIAGNOSIS — M810 Age-related osteoporosis without current pathological fracture: Secondary | ICD-10-CM

## 2014-02-15 DIAGNOSIS — E785 Hyperlipidemia, unspecified: Secondary | ICD-10-CM

## 2014-02-15 LAB — POCT CBC
Granulocyte percent: 79.7 %G (ref 37–80)
HCT, POC: 37.7 % (ref 37.7–47.9)
Hemoglobin: 12.3 g/dL (ref 12.2–16.2)
Lymph, poc: 1.5 (ref 0.6–3.4)
MCH, POC: 28.1 pg (ref 27–31.2)
MCHC: 32.6 g/dL (ref 31.8–35.4)
MCV: 86.2 fL (ref 80–97)
MPV: 8.2 fL (ref 0–99.8)
POC Granulocyte: 7.3 — AB (ref 2–6.9)
POC LYMPH PERCENT: 15.8 %L (ref 10–50)
Platelet Count, POC: 320 10*3/uL (ref 142–424)
RBC: 4.4 M/uL (ref 4.04–5.48)
RDW, POC: 13.2 %
WBC: 9.2 10*3/uL (ref 4.6–10.2)

## 2014-02-15 MED ORDER — LISINOPRIL 20 MG PO TABS: 20.0000 mg | ORAL_TABLET | Freq: Every day | ORAL | Status: DC

## 2014-02-15 NOTE — Patient Instructions (Signed)

## 2014-02-15 NOTE — Progress Notes (Addendum)
Subjective:    Patient ID: Dana Gray, female    DOB: 1955-10-14, 58 y.o.   MRN: [id number]  Patient in today for CPE no PAP- SHe is doing well today without complaints.  Hypertension This is a chronic problem. The current episode started more than 1 year ago. The problem is unchanged. The problem is uncontrolled (usually in 324'M systolic). Pertinent negatives include no chest pain, headaches, neck pain, palpitations, peripheral edema, shortness of breath or sweats. There are no associated agents to hypertension. Risk factors for coronary artery disease include post-menopausal state and dyslipidemia. Past treatments include ACE inhibitors. There are no compliance problems.   Hyperlipidemia This is a chronic problem. The current episode started more than 1 year ago. The problem is controlled. Recent lipid tests were reviewed and are normal. Associated symptoms include myalgias. Pertinent negatives include no chest pain or shortness of breath. Treatments tried: patient never statred on lipitor as Rx. The current treatment provides moderate improvement of lipids. Risk factors for coronary artery disease include hypertension and post-menopausal.  insomnia Intermittent- Doesn't want to take meds for this.   Review of Systems  Constitutional: Positive for fatigue.  Respiratory: Negative for shortness of breath.   Cardiovascular: Negative for chest pain and palpitations.  Musculoskeletal: Positive for arthralgias and myalgias. Negative for neck pain.  Neurological: Negative for headaches.  Hematological: Negative.   Psychiatric/Behavioral: Negative.        Objective:   Physical Exam  Constitutional: She is oriented to person, place, and time. She appears well-developed and well-nourished.  HENT:  Nose: Nose normal.  Mouth/Throat: Oropharynx is clear and moist.  Eyes: EOM are normal.  Neck: Trachea normal, normal range of motion and full passive range of motion without pain. Neck supple.  No JVD present. Carotid bruit is not present. No thyromegaly present.  Cardiovascular: Normal rate, regular rhythm, normal heart sounds and intact distal pulses.  Exam reveals no gallop and no friction rub.   No murmur heard. Pulmonary/Chest: Effort normal and breath sounds normal.  Abdominal: Soft. Bowel sounds are normal. She exhibits no distension and no mass. There is no tenderness.  Musculoskeletal: Normal range of motion.  Lymphadenopathy:    She has no cervical adenopathy.  Neurological: She is alert and oriented to person, place, and time. She has normal reflexes.  Skin: Skin is warm and dry.  Psychiatric: She has a normal mood and affect. Her behavior is normal. Judgment and thought content normal.   BP 133/82  Pulse 61  Temp(Src) 97.8 F (36.6 C) (Oral)  Ht 5' 3.5" (1.613 m)  Wt 153 lb 3.2 oz (69.491 kg)  BMI 26.71 kg/m2  EKG- Sinus bradycardia-Mary-Margaret Hassell Done, FNP Chest x-ray- no acute findings-Preliminary reading by Ronnald Collum, FNP  [facility]       Assessment & Plan:   1. Annual physical exam   2. Osteoporosis   3. Insomnia   4. Hyperlipidemia   5. Essential hypertension    Orders Placed This Encounter  Procedures  . DG Chest 2 View    Standing Status: Future     Number of Occurrences:      Standing Expiration Date: 04/17/2015    Order Specific Question:  Reason for Exam (SYMPTOM  OR DIAGNOSIS REQUIRED)    Answer:  screening    Order Specific Question:  Is the patient pregnant?    Answer:  No    Order Specific Question:  Preferred imaging location?    Answer:  Internal  . Tdap vaccine greater  than or equal to 7yo IM  . CMP14+EGFR  . NMR, lipoprofile  . Thyroid Panel With TSH  . POCT CBC  . EKG 12-Lead   Meds ordered this encounter  Medications  . lisinopril (PRINIVIL,ZESTRIL) 20 MG tablet    Sig: Take 1 tablet (20 mg total) by mouth daily.    Dispense:  90 tablet    Refill:  1    Order Specific Question:  Supervising Provider    Answer:  Chipper Herb [[id number]]   Refuses colonoscopy and mammo due to ins changing at the end of the month- will discuss at next visit. Labs pending Health maintenance reviewed Diet and exercise encouraged Continue all meds Follow up  In 6 months   [place], FNP

## 2014-02-16 LAB — THYROID PANEL WITH TSH
Free Thyroxine Index: 1.8 (ref 1.2–4.9)
T3 Uptake Ratio: 25 % (ref 24–39)
T4, Total: 7.2 ug/dL (ref 4.5–12.0)
TSH: 1.21 u[IU]/mL (ref 0.450–4.500)

## 2014-02-16 LAB — NMR, LIPOPROFILE
Cholesterol: 167 mg/dL (ref 100–199)
HDL Cholesterol by NMR: 96 mg/dL
HDL Particle Number: 34.5 umol/L
LDL Particle Number: 605 nmol/L
LDL Size: 21.1 nm
LDLC SERPL CALC-MCNC: 61 mg/dL (ref 0–99)
LP-IR Score: <25
Small LDL Particle Number: <90 nmol/L
Triglycerides by NMR: 49 mg/dL (ref 0–149)

## 2014-02-16 LAB — CMP14+EGFR
ALT: 19 IU/L (ref 0–32)
AST: 25 IU/L (ref 0–40)
Albumin/Globulin Ratio: 2 (ref 1.1–2.5)
Albumin: 4.6 g/dL (ref 3.6–4.8)
Alkaline Phosphatase: 106 IU/L (ref 39–117)
BUN/Creatinine Ratio: 21 (ref 11–26)
BUN: 17 mg/dL (ref 8–27)
CO2: 23 mmol/L (ref 18–29)
Calcium: 9.5 mg/dL (ref 8.7–10.3)
Chloride: 103 mmol/L (ref 97–108)
Creatinine, Ser: 0.81 mg/dL (ref 0.57–1.00)
GFR calc Af Amer: 91 mL/min/1.73
GFR calc non Af Amer: 79 mL/min/1.73
Globulin, Total: 2.3 g/dL (ref 1.5–4.5)
Glucose: 93 mg/dL (ref 65–99)
Potassium: 4.7 mmol/L (ref 3.5–5.2)
Sodium: 144 mmol/L (ref 134–144)
Total Bilirubin: 0.2 mg/dL (ref 0.0–1.2)
Total Protein: 6.9 g/dL (ref 6.0–8.5)

## 2014-02-22 ENCOUNTER — Encounter: Payer: BC Managed Care – PPO | Admitting: Nurse Practitioner

## 2014-03-02 ENCOUNTER — Telehealth: Payer: Self-pay | Admitting: Nurse Practitioner

## 2014-03-02 NOTE — Telephone Encounter (Signed)
Pt will verify with insurance company if form was faxed. Labs from 811/15 discussed with pt.

## 2014-04-22 ENCOUNTER — Other Ambulatory Visit: Payer: Self-pay

## 2014-10-07 ENCOUNTER — Encounter: Payer: Self-pay | Admitting: Nurse Practitioner

## 2014-10-07 ENCOUNTER — Other Ambulatory Visit: Payer: Self-pay | Admitting: Nurse Practitioner

## 2014-10-07 ENCOUNTER — Ambulatory Visit (INDEPENDENT_AMBULATORY_CARE_PROVIDER_SITE_OTHER): Payer: BLUE CROSS/BLUE SHIELD | Admitting: Nurse Practitioner

## 2014-10-07 VITALS — BP 132/80 | HR 73 | Temp 98.1°F | Ht 63.5 in | Wt 157.0 lb

## 2014-10-07 DIAGNOSIS — E785 Hyperlipidemia, unspecified: Secondary | ICD-10-CM | POA: Diagnosis not present

## 2014-10-07 DIAGNOSIS — N951 Menopausal and female climacteric states: Secondary | ICD-10-CM

## 2014-10-07 DIAGNOSIS — I10 Essential (primary) hypertension: Secondary | ICD-10-CM

## 2014-10-07 MED ORDER — LISINOPRIL 20 MG PO TABS: 20.0000 mg | ORAL_TABLET | Freq: Every day | ORAL | Status: DC

## 2014-10-07 MED ORDER — ESTROGENS, CONJUGATED 0.625 MG/GM VA CREA: 1.0000 | TOPICAL_CREAM | Freq: Every day | VAGINAL | Status: DC

## 2014-10-07 NOTE — Progress Notes (Addendum)
  Subjective:    Patient ID: Dana Gray, female    DOB: Jan 28, 1956, 59 y.o.   MRN: [id number]   Patient here today for follow up of chronic medical problems.   Hyperlipidemia This is a chronic problem. The current episode started more than 1 year ago. The problem is controlled. Recent lipid tests were reviewed and are normal. Associated symptoms include myalgias. Pertinent negatives include no chest pain or shortness of breath. Treatments tried: patient never statred on lipitor as Rx. The current treatment provides moderate improvement of lipids. Risk factors for coronary artery disease include hypertension and post-menopausal.  Hypertension This is a chronic problem. The current episode started more than 1 year ago. The problem is unchanged. The problem is uncontrolled (usually in 390'Z systolic). Pertinent negatives include no chest pain, headaches, neck pain, palpitations, peripheral edema, shortness of breath or sweats. There are no associated agents to hypertension. Risk factors for coronary artery disease include post-menopausal state and dyslipidemia. Past treatments include ACE inhibitors. The current treatment provides moderate improvement. There are no compliance problems.   insomnia Intermittent- Doesn't want to take meds for this.  * C/o  Vaginal dryness with intercourse.  Review of Systems  Constitutional: Positive for fatigue.  Respiratory: Negative for shortness of breath.   Cardiovascular: Negative for chest pain and palpitations.  Musculoskeletal: Positive for myalgias and arthralgias. Negative for neck pain.  Neurological: Negative for headaches.  Hematological: Negative.   Psychiatric/Behavioral: Negative.        Objective:   Physical Exam  Constitutional: She is oriented to person, place, and time. She appears well-developed and well-nourished.  HENT:  Nose: Nose normal.  Mouth/Throat: Oropharynx is clear and moist.  Eyes: EOM are normal.  Neck: Trachea normal,  normal range of motion and full passive range of motion without pain. Neck supple. No JVD present. Carotid bruit is not present. No thyromegaly present.  Cardiovascular: Normal rate, regular rhythm, normal heart sounds and intact distal pulses.  Exam reveals no gallop and no friction rub.   No murmur heard. Pulmonary/Chest: Effort normal and breath sounds normal.  Abdominal: Soft. Bowel sounds are normal. She exhibits no distension and no mass. There is no tenderness.  Musculoskeletal: Normal range of motion.  Lymphadenopathy:    She has no cervical adenopathy.  Neurological: She is alert and oriented to person, place, and time. She has normal reflexes.  Skin: Skin is warm and dry.  Psychiatric: She has a normal mood and affect. Her behavior is normal. Judgment and thought content normal.   BP 132/80 mmHg  Pulse 73  Temp(Src) 98.1 F (36.7 C) (Oral)  Ht 5' 3.5" (1.613 m)  Wt 157 lb (71.215 kg)  BMI 27.37 kg/m2        Assessment & Plan:  1. Hyperlipidemia Low fat diet  2. Essential hypertension Do not add slat to diet - lisinopril (PRINIVIL,ZESTRIL) 20 MG tablet; Take 1 tablet (20 mg total) by mouth daily.  Dispense: 90 tablet; Refill: 1  3. Menopausal vaginal dryness - premarin cream 3X a week  Orders Placed This Encounter  Procedures  . CMP14+EGFR  . NMR, lipoprofile     Labs pending Health maintenance reviewed Diet and exercise encouraged Continue all meds Follow up  In 6 month   [place], FNP

## 2014-10-07 NOTE — Patient Instructions (Addendum)
Exercise to Stay Healthy Exercise helps you become and stay healthy. EXERCISE IDEAS AND TIPS Choose exercises that:  You enjoy.  Fit into your day. You do not need to exercise really hard to be healthy. You can do exercises at a slow or medium level and stay healthy. You can:  Stretch before and after working out.  Try yoga, Pilates, or tai chi.  Lift weights.  Walk fast, swim, jog, run, climb stairs, bicycle, dance, or rollerskate.  Take aerobic classes. Exercises that burn about 150 calories:  Running 1  miles in 15 minutes.  Playing volleyball for 45 to 60 minutes.  Washing and waxing a car for 45 to 60 minutes.  Playing touch football for 45 minutes.  Walking 1  miles in 35 minutes.  Pushing a stroller 1  miles in 30 minutes.  Playing basketball for 30 minutes.  Raking leaves for 30 minutes.  Bicycling 5 miles in 30 minutes.  Walking 2 miles in 30 minutes.  Dancing for 30 minutes.  Shoveling snow for 15 minutes.  Swimming laps for 20 minutes.  Walking up stairs for 15 minutes.  Bicycling 4 miles in 15 minutes.  Gardening for 30 to 45 minutes.  Jumping rope for 15 minutes.  Washing windows or floors for 45 to 60 minutes. Document Released: 07/27/2010 Document Revised: 09/16/2011 Document Reviewed: 07/27/2010 Exit[employer] Patient Information 2015 [employer]. This information is not intended to replace advice given to you by your health care provider. Make sure you discuss any questions you have with your health care provider.  

## 2014-10-07 NOTE — Addendum Note (Signed)
Addended by: Bennie PieriniMARTIN, MARY-MARGARET on: 10/07/2014 04:39 PM   Modules accepted: Orders

## 2014-10-08 LAB — CMP14+EGFR
ALT: 15 IU/L (ref 0–32)
AST: 23 IU/L (ref 0–40)
Albumin/Globulin Ratio: 1.7 (ref 1.1–2.5)
Albumin: 4.4 g/dL (ref 3.6–4.8)
Alkaline Phosphatase: 116 IU/L (ref 39–117)
BUN/Creatinine Ratio: 27 — ABNORMAL HIGH (ref 11–26)
BUN: 17 mg/dL (ref 8–27)
Bilirubin Total: 0.2 mg/dL (ref 0.0–1.2)
CO2: 25 mmol/L (ref 18–29)
Calcium: 9.2 mg/dL (ref 8.7–10.3)
Chloride: 103 mmol/L (ref 97–108)
Creatinine, Ser: 0.62 mg/dL (ref 0.57–1.00)
GFR calc Af Amer: 113 mL/min/{1.73_m2} (ref >59)
GFR calc non Af Amer: 98 mL/min/{1.73_m2} (ref >59)
Globulin, Total: 2.6 g/dL (ref 1.5–4.5)
Glucose: 107 mg/dL — ABNORMAL HIGH (ref 65–99)
Potassium: 4.4 mmol/L (ref 3.5–5.2)
Sodium: 140 mmol/L (ref 134–144)
Total Protein: 7 g/dL (ref 6.0–8.5)

## 2014-10-08 LAB — NMR, LIPOPROFILE
Cholesterol: 200 mg/dL — ABNORMAL HIGH (ref 100–199)
HDL Cholesterol by NMR: 77 mg/dL (ref >39)
HDL Particle Number: 37 umol/L (ref >=30.5)
LDL Particle Number: 1103 nmol/L — ABNORMAL HIGH (ref <1000)
LDL Size: 21.6 nm (ref >20.5)
LDL-C: 109 mg/dL — ABNORMAL HIGH (ref 0–99)
LP-IR Score: <25 (ref <=45)
Small LDL Particle Number: <90 nmol/L (ref <=527)
Triglycerides by NMR: 68 mg/dL (ref 0–149)

## 2014-11-30 ENCOUNTER — Encounter: Payer: Self-pay | Admitting: Family Medicine

## 2015-09-25 ENCOUNTER — Ambulatory Visit (INDEPENDENT_AMBULATORY_CARE_PROVIDER_SITE_OTHER): Payer: BLUE CROSS/BLUE SHIELD | Admitting: Pediatrics

## 2015-09-25 ENCOUNTER — Encounter: Payer: Self-pay | Admitting: Pediatrics

## 2015-09-25 ENCOUNTER — Ambulatory Visit (INDEPENDENT_AMBULATORY_CARE_PROVIDER_SITE_OTHER): Payer: BLUE CROSS/BLUE SHIELD

## 2015-09-25 VITALS — BP 141/79 | HR 83 | Temp 97.7°F | Ht 63.5 in | Wt 158.2 lb

## 2015-09-25 DIAGNOSIS — R1032 Left lower quadrant pain: Secondary | ICD-10-CM

## 2015-09-25 DIAGNOSIS — I10 Essential (primary) hypertension: Secondary | ICD-10-CM | POA: Diagnosis not present

## 2015-09-25 LAB — URINALYSIS
Bilirubin, UA: NEGATIVE
Glucose, UA: NEGATIVE
Ketones, UA: NEGATIVE
Nitrite, UA: NEGATIVE
Protein, UA: NEGATIVE
RBC, UA: NEGATIVE
Specific Gravity, UA: 1.01 (ref 1.005–1.030)
Urobilinogen, Ur: 0.2 mg/dL (ref 0.2–1.0)
pH, UA: 6 (ref 5.0–7.5)

## 2015-09-25 LAB — URINALYSIS, MICROSCOPIC ONLY
Bacteria, UA: NONE SEEN
Renal Epithel, UA: NONE SEEN /hpf

## 2015-09-25 NOTE — Patient Instructions (Signed)
Ibuprofen 600mg three times a day

## 2015-09-25 NOTE — Progress Notes (Signed)
    Subjective:    Patient ID: Dana Gray, female    DOB: 07/02/1956, 60 y.o.   MRN: [id number]  CC: Back Pain and Leg Pain   HPISeleta Rhymes: Dana Gray is a 60 y.o. female presenting for Back Pain and Leg Pain  Pain L side of back, circles around abd, goes into groin and down to knee, knee doesn't hurt Stretching relieves  Has had trouble with her hip in the past Started 3 days ago, sat on hard bleachers that day for a coupleof hours Woke up in the night felt like she had a charlie horse in her leg, hasnt gone away Pain is constant, positional, certain positions sometimes make it worse, such as lying down. No injuries that she knows of  HTN: gets up 140s/90s, sometimes to 130s/80s   Dep[facility] screen [facility] 2/9 09/25/2015 10/07/2014 02/15/2014  Decreased Interest 0 0 0  Down, Depressed, Hopeless 0 0 0  PHQ - 2 Score 0 0 0     Relevant past medical, surgical, family and social history reviewed and updated as indicated. Interim medical history since our last visit reviewed. Allergies and medications reviewed and updated.    ROS: Per HPI unless specifically indicated above  History  Smoking status  . Never Smoker   Smokeless tobacco  . Not on file    Past Medical History Patient Active Problem List   Diagnosis Date Noted  . Hyperlipidemia 11/20/2012  . Insomnia 11/20/2012  . Osteoporosis 11/20/2012  . HTN (hypertension) 03/07/2011  . Chest pain 03/07/2011        Objective:    BP 141/79 mmHg  Pulse 83  Temp(Src) 97.7 F (36.5 C) (Oral)  Ht 5' 3.5" (1.613 m)  Wt 158 lb 3.2 oz (71.759 kg)  BMI 27.58 kg/m2  Wt Readings from Last 3 Encounters:  09/25/15 158 lb 3.2 oz (71.759 kg)  10/07/14 157 lb (71.215 kg)  02/15/14 153 lb 3.2 oz (69.491 kg)     Gen: NAD, alert, cooperative with exam, NCAT EYES: EOMI, no scleral injection or icterus CV: WWP Resp: normal WOB Abd: +BS, soft, NTND. no guarding or organomegaly Ext: No edema, warm Neuro: Alert and oriented,  flex/ext at knee 5/5 strength b/l, hip flexor 5/5 b/l MSK: decreased internal rotation L hip, limited by pain compared with R. Pain in groin with SLR, no back pain. No point tenderness over spine or paraspinous muscles.     Assessment & Plan:    Eunice BlaseDebbie was seen today for back pain and leg pain, likely involving hip joint, possibly with referred pain to the knee.   Diagnoses and all orders for this visit:  Groin pain, left -     Ambulatory referral to Orthopedic Surgery -     DG HIP UNILAT W OR W/O PELVIS 2-3 VIEWS LEFT; Future -     Urine Microscopic -     Urinalysis  Essential hypertension Elevated today, check daily at home, bring back to next clinic visit. Due for well exam.   Follow up plan: Return in about 2 weeks (around 10/09/2015) for for Well exam.  Rex Krasarol Vincent, MD [facility] 09/25/2015, 11:03 AM

## 2015-10-09 ENCOUNTER — Encounter: Payer: BLUE CROSS/BLUE SHIELD | Admitting: Nurse Practitioner

## 2015-10-10 ENCOUNTER — Encounter: Payer: Self-pay | Admitting: Nurse Practitioner

## 2015-10-10 ENCOUNTER — Telehealth: Payer: Self-pay | Admitting: Nurse Practitioner

## 2015-10-10 NOTE — Telephone Encounter (Signed)
Patient aware that she needs to be seen by ortho

## 2015-10-13 ENCOUNTER — Ambulatory Visit: Payer: BLUE CROSS/BLUE SHIELD

## 2015-10-13 ENCOUNTER — Ambulatory Visit (INDEPENDENT_AMBULATORY_CARE_PROVIDER_SITE_OTHER): Payer: BLUE CROSS/BLUE SHIELD | Admitting: Nurse Practitioner

## 2015-10-13 ENCOUNTER — Encounter: Payer: Self-pay | Admitting: Nurse Practitioner

## 2015-10-13 ENCOUNTER — Ambulatory Visit (INDEPENDENT_AMBULATORY_CARE_PROVIDER_SITE_OTHER): Payer: BLUE CROSS/BLUE SHIELD

## 2015-10-13 VITALS — BP 133/82 | HR 80 | Temp 96.5°F | Ht 63.0 in | Wt 152.0 lb

## 2015-10-13 DIAGNOSIS — Z1159 Encounter for screening for other viral diseases: Secondary | ICD-10-CM

## 2015-10-13 DIAGNOSIS — E785 Hyperlipidemia, unspecified: Secondary | ICD-10-CM

## 2015-10-13 DIAGNOSIS — Z13820 Encounter for screening for osteoporosis: Secondary | ICD-10-CM | POA: Diagnosis not present

## 2015-10-13 DIAGNOSIS — Z1212 Encounter for screening for malignant neoplasm of rectum: Secondary | ICD-10-CM

## 2015-10-13 DIAGNOSIS — Z6826 Body mass index (BMI) 26.0-26.9, adult: Secondary | ICD-10-CM

## 2015-10-13 DIAGNOSIS — G4700 Insomnia, unspecified: Secondary | ICD-10-CM

## 2015-10-13 DIAGNOSIS — I10 Essential (primary) hypertension: Secondary | ICD-10-CM

## 2015-10-13 DIAGNOSIS — M810 Age-related osteoporosis without current pathological fracture: Secondary | ICD-10-CM | POA: Diagnosis not present

## 2015-10-13 DIAGNOSIS — H43391 Other vitreous opacities, right eye: Secondary | ICD-10-CM

## 2015-10-13 MED ORDER — LISINOPRIL 20 MG PO TABS: 20.0000 mg | ORAL_TABLET | Freq: Every day | ORAL | Status: DC

## 2015-10-13 NOTE — Patient Instructions (Signed)
Bone Health Bones protect organs, store calcium, and anchor muscles. Good health habits, such as eating nutritious foods and exercising regularly, are important for maintaining healthy bones. They can also help to prevent a condition that causes bones to lose density and become weak and brittle (osteoporosis). WHY IS BONE MASS IMPORTANT? Bone mass refers to the amount of bone tissue that you have. The higher your bone mass, the stronger your bones. An important step toward having healthy bones throughout life is to have strong and dense bones during childhood. A young adult who has a high bone mass is more likely to have a high bone mass later in life. Bone mass at its greatest it is called peak bone mass. A large decline in bone mass occurs in older adults. In women, it occurs about the time of menopause. During this time, it is important to practice good health habits, because if more bone is lost than what is replaced, the bones will become less healthy and more likely to break (fracture). If you find that you have a low bone mass, you may be able to prevent osteoporosis or further bone loss by changing your diet and lifestyle. HOW CAN I FIND OUT IF MY BONE MASS IS LOW? Bone mass can be measured with an X-ray test that is called a bone mineral density (BMD) test. This test is recommended for all women who are age 65 or older. It may also be recommended for men who are age 70 or older, or for people who are more likely to develop osteoporosis due to:  Having bones that break easily.  Having a long-term disease that weakens bones, such as kidney disease or rheumatoid arthritis.  Having menopause earlier than normal.  Taking medicine that weakens bones, such as steroids, thyroid hormones, or hormone treatment for breast cancer or prostate cancer.  Smoking.  Drinking three or more alcoholic drinks each day. WHAT ARE THE NUTRITIONAL RECOMMENDATIONS FOR HEALTHY BONES? To have healthy bones, you need  to get enough of the right minerals and vitamins. Most nutrition experts recommend getting these nutrients from the foods that you eat. Nutritional recommendations vary from person to person. Ask your health care provider what is healthy for you. Here are some general guidelines. Calcium Recommendations Calcium is the most important (essential) mineral for bone health. Most people can get enough calcium from their diet, but supplements may be recommended for people who are at risk for osteoporosis. Good sources of calcium include:  Dairy products, such as low-fat or nonfat milk, cheese, and yogurt.  Dark green leafy vegetables, such as bok choy and broccoli.  Calcium-fortified foods, such as orange juice, cereal, bread, soy beverages, and tofu products.  Nuts, such as almonds. Follow these recommended amounts for daily calcium intake:  Children, age 1-3: 700 mg.  Children, age 4-8: 1,000 mg.  Children, age 9-13: 1,300 mg.  Teens, age 14-18: 1,300 mg.  Adults, age 19-50: 1,000 mg.  Adults, age 51-70:  Men: 1,000 mg.  Women: 1,200 mg.  Adults, age 71 or older: 1,200 mg.  Pregnant and breastfeeding females:  Teens: 1,300 mg.  Adults: 1,000 mg. Vitamin D Recommendations Vitamin D is the most essential vitamin for bone health. It helps the body to absorb calcium. Sunlight stimulates the skin to make vitamin D, so be sure to get enough sunlight. If you live in a cold climate or you do not get outside often, your health care provider may recommend that you take vitamin D supplements. Good   sources of vitamin D in your diet include:  Egg yolks.  Saltwater fish.  Milk and cereal fortified with vitamin D. Follow these recommended amounts for daily vitamin D intake:  Children and teens, age 1-18: 600 international units.  Adults, age 50 or younger: 400-800 international units.  Adults, age 51 or older: 800-1,000 international units. Other Nutrients Other nutrients for bone  health include:  Phosphorus. This mineral is found in meat, poultry, dairy foods, nuts, and legumes. The recommended daily intake for adult men and adult women is 700 mg.  Magnesium. This mineral is found in seeds, nuts, dark green vegetables, and legumes. The recommended daily intake for adult men is 400-420 mg. For adult women, it is 310-320 mg.  Vitamin K. This vitamin is found in green leafy vegetables. The recommended daily intake is 120 mg for adult men and 90 mg for adult women. WHAT TYPE OF PHYSICAL ACTIVITY IS BEST FOR BUILDING AND MAINTAINING HEALTHY BONES? Weight-bearing and strength-building activities are important for building and maintaining peak bone mass. Weight-bearing activities cause muscles and bones to work against gravity. Strength-building activities increases muscle strength that supports bones. Weight-bearing and muscle-building activities include:  Walking and hiking.  Jogging and running.  Dancing.  Gym exercises.  Lifting weights.  Tennis and racquetball.  Climbing stairs.  Aerobics. Adults should get at least 30 minutes of moderate physical activity on most days. Children should get at least 60 minutes of moderate physical activity on most days. Ask your health care provide what type of exercise is best for you. WHERE CAN I FIND MORE INFORMATION? For more information, check out the following websites:  National Osteoporosis Foundation: http://nof.org/learn/basics  National Institutes of Health: http://www.niams.nih.gov/Health_Info/Bone/Bone_Health/bone_health_for_life.asp   This information is not intended to replace advice given to you by your health care provider. Make sure you discuss any questions you have with your health care provider.   Document Released: 09/14/2003 Document Revised: 11/08/2014 Document Reviewed: 06/29/2014 Elsevier Interactive Patient Education 2016 Elsevier Inc.  

## 2015-10-13 NOTE — Progress Notes (Signed)
Subjective:    Patient ID: Dana Gray, female    DOB: [birth year], 60 y.o.   MRN: [id number]   Patient here today for follow up of chronic medical problems.  Outpatient Encounter Prescriptions as of 10/13/2015  Medication Sig  . cetirizine (ZYRTEC) 10 MG tablet Take 10 mg by mouth daily.    . Cholecalciferol (VITAMIN D) 1000 UNITS capsule Take 1 capsule (1,000 Units total) by mouth daily.  Marland Kitchen conjugated estrogens (PREMARIN) vaginal cream Place 1 Applicatorful vaginally daily.  Marland Kitchen lisinopril (PRINIVIL,ZESTRIL) 20 MG tablet Take 1 tablet (20 mg total) by mouth daily.   No facility-administered encounter medications on file as of 10/13/2015.   * Patient has been seen 09/25/15 by Dr. Evette Doffing- was dx with arthritis- has appointment with ortho on April 17,2017. * Patient c/o seeing floaters- started on 09/23/15- has not changed  Hyperlipidemia This is a chronic problem. The current episode started more than 1 year ago. The problem is controlled. Recent lipid tests were reviewed and are normal. Associated symptoms include myalgias. Pertinent negatives include no chest pain or shortness of breath. Treatments tried: patient never statred on lipitor as Rx. The current treatment provides moderate improvement of lipids. Risk factors for coronary artery disease include hypertension and post-menopausal.  Hypertension This is a chronic problem. The current episode started more than 1 year ago. The problem is unchanged. The problem is uncontrolled (usually in 010'U systolic). Pertinent negatives include no chest pain, headaches, neck pain, palpitations, peripheral edema, shortness of breath or sweats. There are no associated agents to hypertension. Risk factors for coronary artery disease include post-menopausal state and dyslipidemia. Past treatments include ACE inhibitors. The current treatment provides moderate improvement. There are no compliance problems.   insomnia Intermittent- Doesn't want to take meds for  this. osteoporosis Currently on no meds- tries to do weight bearing exercises.  Review of Systems  Constitutional: Positive for fatigue.  Respiratory: Negative for shortness of breath.   Cardiovascular: Negative for chest pain and palpitations.  Musculoskeletal: Positive for myalgias and arthralgias. Negative for neck pain.  Neurological: Negative for headaches.  Hematological: Negative.   Psychiatric/Behavioral: Negative.        Objective:   Physical Exam  Constitutional: She is oriented to person, place, and time. She appears well-developed and well-nourished.  HENT:  Nose: Nose normal.  Mouth/Throat: Oropharynx is clear and moist.  Eyes: EOM are normal.  Neck: Trachea normal, normal range of motion and full passive range of motion without pain. Neck supple. No JVD present. Carotid bruit is not present. No thyromegaly present.  Cardiovascular: Normal rate, regular rhythm, normal heart sounds and intact distal pulses.  Exam reveals no gallop and no friction rub.   No murmur heard. Pulmonary/Chest: Effort normal and breath sounds normal.  Abdominal: Soft. Bowel sounds are normal. She exhibits no distension and no mass. There is no tenderness.  Musculoskeletal: Normal range of motion.  Lymphadenopathy:    She has no cervical adenopathy.  Neurological: She is alert and oriented to person, place, and time. She has normal reflexes.  Skin: Skin is warm and dry.  Psychiatric: She has a normal mood and affect. Her behavior is normal. Judgment and thought content normal.   BP 133/82 mmHg  Pulse 80  Temp(Src) 96.5 F (35.8 C) (Oral)  Ht '5\' 3"'$  (1.6 m)  Wt 152 lb (68.947 kg)  BMI 26.93 kg/m2  Chest xray- no cardiopulmonary disease-Preliminary reading by Ronnald Collum, FNP  [facility]   EKG- NSR-Mary-Margaret Hassell Done, FNP  Assessment & Plan:  1. Essential hypertension *2do not add salt to diet** - CMP14+EGFR - lisinopril (PRINIVIL,ZESTRIL) 20 MG tablet; Take 1 tablet (20 mg  total) by mouth daily.  Dispense: 90 tablet; Refill: 1  2. Osteoporosis Weight bearing exercises dexa done today  3. Hyperlipidemia Low fat diet - Lipid panel  4. Insomnia bedtime ritual  5. BMI 26.0-26.9,adult Discussed diet and exercise for person with BMI >25 Will recheck weight in 3-6 months  6. Floaters in visual field, right Needs to see eye doctor ASAP  7. Screening for malignant neoplasm of the rectum - Fecal occult blood, imunochemical; Future  8. Need for hepatitis C screening test - Hepatitis C antibody    Labs pending Health maintenance reviewed Diet and exercise encouraged Continue all meds Follow up  In 6 month   [place], FNP

## 2015-10-14 LAB — LIPID PANEL
Chol/HDL Ratio: 2.5 ratio units (ref 0.0–4.4)
Cholesterol, Total: 176 mg/dL (ref 100–199)
HDL: 70 mg/dL (ref >39)
LDL Calculated: 89 mg/dL (ref 0–99)
Triglycerides: 84 mg/dL (ref 0–149)
VLDL Cholesterol Cal: 17 mg/dL (ref 5–40)

## 2015-10-14 LAB — CMP14+EGFR
ALT: 19 IU/L (ref 0–32)
AST: 19 IU/L (ref 0–40)
Albumin/Globulin Ratio: 1.6 (ref 1.2–2.2)
Albumin: 4.4 g/dL (ref 3.6–4.8)
Alkaline Phosphatase: 100 IU/L (ref 39–117)
BUN/Creatinine Ratio: 28 (ref 12–28)
BUN: 22 mg/dL (ref 8–27)
Bilirubin Total: 0.3 mg/dL (ref 0.0–1.2)
CO2: 21 mmol/L (ref 18–29)
Calcium: 9.6 mg/dL (ref 8.7–10.3)
Chloride: 98 mmol/L (ref 96–106)
Creatinine, Ser: 0.79 mg/dL (ref 0.57–1.00)
GFR calc Af Amer: 93 mL/min/{1.73_m2} (ref >59)
GFR calc non Af Amer: 80 mL/min/{1.73_m2} (ref >59)
Globulin, Total: 2.7 g/dL (ref 1.5–4.5)
Glucose: 93 mg/dL (ref 65–99)
Potassium: 4.4 mmol/L (ref 3.5–5.2)
Sodium: 138 mmol/L (ref 134–144)
Total Protein: 7.1 g/dL (ref 6.0–8.5)

## 2015-10-14 LAB — HEPATITIS C ANTIBODY: Hep C Virus Ab: <0.1 s/co ratio (ref 0.0–0.9)

## 2016-04-17 ENCOUNTER — Other Ambulatory Visit: Payer: Self-pay | Admitting: Nurse Practitioner

## 2016-04-17 DIAGNOSIS — I10 Essential (primary) hypertension: Secondary | ICD-10-CM

## 2016-04-23 ENCOUNTER — Ambulatory Visit (INDEPENDENT_AMBULATORY_CARE_PROVIDER_SITE_OTHER): Payer: BLUE CROSS/BLUE SHIELD | Admitting: Nurse Practitioner

## 2016-04-23 ENCOUNTER — Encounter: Payer: Self-pay | Admitting: Nurse Practitioner

## 2016-04-23 VITALS — BP 130/82 | HR 64 | Temp 97.4°F | Ht 63.0 in | Wt 164.0 lb

## 2016-04-23 DIAGNOSIS — Z6829 Body mass index (BMI) 29.0-29.9, adult: Secondary | ICD-10-CM | POA: Diagnosis not present

## 2016-04-23 DIAGNOSIS — E785 Hyperlipidemia, unspecified: Secondary | ICD-10-CM | POA: Diagnosis not present

## 2016-04-23 DIAGNOSIS — F5101 Primary insomnia: Secondary | ICD-10-CM

## 2016-04-23 DIAGNOSIS — M810 Age-related osteoporosis without current pathological fracture: Secondary | ICD-10-CM

## 2016-04-23 DIAGNOSIS — I10 Essential (primary) hypertension: Secondary | ICD-10-CM | POA: Diagnosis not present

## 2016-04-23 MED ORDER — ESTROGENS, CONJUGATED 0.625 MG/GM VA CREA: 1.0000 | TOPICAL_CREAM | Freq: Every day | VAGINAL | 12 refills | Status: DC

## 2016-04-23 MED ORDER — LISINOPRIL 20 MG PO TABS: 20.0000 mg | ORAL_TABLET | Freq: Every day | ORAL | 1 refills | Status: DC

## 2016-04-23 NOTE — Patient Instructions (Signed)

## 2016-04-23 NOTE — Progress Notes (Addendum)
Subjective:    Patient ID: Dana Gray, female    DOB: Sep 14, 1955, 60 y.o.   MRN: [id number]   Patient here today for follow up of chronic medical problems. Patient did see an eye doctor for her previous complaint of floaters- that has since resolved.  Outpatient Encounter Prescriptions as of 04/23/2016  Medication Sig  . cetirizine (ZYRTEC) 10 MG tablet Take 10 mg by mouth daily.    . Cholecalciferol (VITAMIN D) 1000 UNITS capsule Take 1 capsule (1,000 Units total) by mouth daily.  Marland Kitchen conjugated estrogens (PREMARIN) vaginal cream Place 1 Applicatorful vaginally daily.  Marland Kitchen lisinopril (PRINIVIL,ZESTRIL) 20 MG tablet TAKE ONE TABLET BY MOUTH ONCE DAILY   No facility-administered encounter medications on file as of 04/23/2016.      Hyperlipidemia  This is a chronic problem. The current episode started more than 1 year ago. The problem is controlled. Recent lipid tests were reviewed and are normal. Associated symptoms include myalgias. Pertinent negatives include no chest pain or shortness of breath. Treatments tried: patient never statred on lipitor as Rx. The current treatment provides moderate improvement of lipids. Risk factors for coronary artery disease include hypertension and post-menopausal.  Hypertension  This is a chronic problem. The current episode started more than 1 year ago. The problem is unchanged. The problem is uncontrolled (usually in 573'U systolic). Pertinent negatives include no chest pain, headaches, neck pain, palpitations, peripheral edema, shortness of breath or sweats. There are no associated agents to hypertension. Risk factors for coronary artery disease include post-menopausal state and dyslipidemia. Past treatments include ACE inhibitors. The current treatment provides moderate improvement. There are no compliance problems.   insomnia Intermittent- Doesn't want to take meds for this. osteoporosis Currently on no meds- tries to do weight bearing  exercises.  Review of Systems  Constitutional: Positive for fatigue.  Respiratory: Negative for shortness of breath.   Cardiovascular: Negative for chest pain and palpitations.  Musculoskeletal: Positive for arthralgias and myalgias. Negative for neck pain.  Neurological: Negative for headaches.  Hematological: Negative.   Psychiatric/Behavioral: Negative.        Objective:   Physical Exam  Constitutional: She is oriented to person, place, and time. She appears well-developed and well-nourished.  HENT:  Nose: Nose normal.  Mouth/Throat: Oropharynx is clear and moist.  Eyes: EOM are normal.  Neck: Trachea normal, normal range of motion and full passive range of motion without pain. Neck supple. No JVD present. Carotid bruit is not present. No thyromegaly present.  Cardiovascular: Normal rate, regular rhythm, normal heart sounds and intact distal pulses.  Exam reveals no gallop and no friction rub.   No murmur heard. Pulmonary/Chest: Effort normal and breath sounds normal.  Abdominal: Soft. Bowel sounds are normal. She exhibits no distension and no mass. There is no tenderness.  Musculoskeletal: Normal range of motion.  Lymphadenopathy:    She has no cervical adenopathy.  Neurological: She is alert and oriented to person, place, and time. She has normal reflexes.  Skin: Skin is warm and dry.  Psychiatric: She has a normal mood and affect. Her behavior is normal. Judgment and thought content normal.   BP 130/82   Pulse 64   Temp 97.4 F (36.3 C) (Oral)   Ht '5\' 3"'$  (1.6 m)   Wt 164 lb (74.4 kg)   BMI 29.05 kg/m      Assessment & Plan:  1. Essential hypertension Low salt diet - Lipid panel - CMP14+EGFR - lisinopril (PRINIVIL,ZESTRIL) 20 MG tablet; Take 1 tablet (  20 mg total) by mouth daily.  Dispense: 90 tablet; Refill: 1  2. Osteoporosis without current pathological fracture, unspecified osteoporosis type Weight bearing exercises  3. Hyperlipidemia, unspecified  hyperlipidemia type Low fat diet  4. Primary insomnia Bed time routines  5. BMI 29.0-29.9,adult Weight loss and AHA exercise recommendation discussed    Labs pending Health maintenance reviewed Diet and exercise encouraged Continue all meds Follow up  in 6 months   Jari Favre, RN, NP [place], FNP

## 2016-04-29 ENCOUNTER — Telehealth: Payer: Self-pay | Admitting: Nurse Practitioner

## 2016-04-29 NOTE — Telephone Encounter (Signed)
Pt aware to come back one morning to have labs drawn

## 2016-04-29 NOTE — Telephone Encounter (Signed)
Please call patient and tell her she needs to have labs drawn- did not do while she was here.

## 2016-10-24 ENCOUNTER — Encounter: Payer: Self-pay | Admitting: Nurse Practitioner

## 2016-10-24 ENCOUNTER — Ambulatory Visit (INDEPENDENT_AMBULATORY_CARE_PROVIDER_SITE_OTHER): Payer: BLUE CROSS/BLUE SHIELD | Admitting: Nurse Practitioner

## 2016-10-24 VITALS — BP 141/79 | HR 70 | Temp 97.1°F | Ht 63.0 in | Wt 161.0 lb

## 2016-10-24 DIAGNOSIS — F5101 Primary insomnia: Secondary | ICD-10-CM

## 2016-10-24 DIAGNOSIS — Z6829 Body mass index (BMI) 29.0-29.9, adult: Secondary | ICD-10-CM

## 2016-10-24 DIAGNOSIS — L989 Disorder of the skin and subcutaneous tissue, unspecified: Secondary | ICD-10-CM

## 2016-10-24 DIAGNOSIS — M810 Age-related osteoporosis without current pathological fracture: Secondary | ICD-10-CM

## 2016-10-24 DIAGNOSIS — E785 Hyperlipidemia, unspecified: Secondary | ICD-10-CM

## 2016-10-24 DIAGNOSIS — I10 Essential (primary) hypertension: Secondary | ICD-10-CM

## 2016-10-24 MED ORDER — LISINOPRIL 20 MG PO TABS: 20.0000 mg | ORAL_TABLET | Freq: Every day | ORAL | 1 refills | Status: DC

## 2016-10-24 NOTE — Progress Notes (Signed)
Subjective:    Patient ID: Dana Gray, female    DOB: 1956-01-27, 61 y.o.   MRN: [id number]  HPI  TIMIRA BIEDA is here today for follow up of chronic medical problem.  Outpatient Encounter Prescriptions as of 10/24/2016  Medication Sig  . cetirizine (ZYRTEC) 10 MG tablet Take 10 mg by mouth daily.    . Cholecalciferol (VITAMIN D) 1000 UNITS capsule Take 1 capsule (1,000 Units total) by mouth daily.  Marland Kitchen conjugated estrogens (PREMARIN) vaginal cream Place 1 Applicatorful vaginally daily.  Marland Kitchen lisinopril (PRINIVIL,ZESTRIL) 20 MG tablet Take 1 tablet (20 mg total) by mouth daily.   No facility-administered encounter medications on file as of 10/24/2016.     1. Essential hypertension  No c/o chest pain, SOB or HA- taking lisinopril- does not check blood pressure at home  2. Osteoporosis without current pathological fracture, unspecified osteoporosis type  Tries to do some weight bearing exercise but does not do daily  3. Hyperlipidemia, unspecified hyperlipidemia type  On no statin- trying to watch diet  4. Primary insomnia  Currently not taking anything. Only has trouble sleeping a couple of tomes a week  5. BMI 29.0-29.9,adult  No recent weight gain or weight loss    New complaints: Lesion on left lower leg-itchy and scaley     Review of Systems  Constitutional: Negative.   HENT: Negative.   Respiratory: Negative.   Cardiovascular: Negative.   Genitourinary: Negative.   Neurological: Negative.   Psychiatric/Behavioral: Negative.   All other systems reviewed and are negative.      Objective:   Physical Exam  Constitutional: She is oriented to person, place, and time. She appears well-developed and well-nourished.  HENT:  Nose: Nose normal.  Mouth/Throat: Oropharynx is clear and moist.  Eyes: EOM are normal.  Neck: Trachea normal, normal range of motion and full passive range of motion without pain. Neck supple. No JVD present. Carotid bruit is not present. No  thyromegaly present.  Cardiovascular: Normal rate, regular rhythm, normal heart sounds and intact distal pulses.  Exam reveals no gallop and no friction rub.   No murmur heard. Pulmonary/Chest: Effort normal and breath sounds normal.  Abdominal: Soft. Bowel sounds are normal. She exhibits no distension and no mass. There is no tenderness.  Musculoskeletal: Normal range of motion.  Lymphadenopathy:    She has no cervical adenopathy.  Neurological: She is alert and oriented to person, place, and time. She has normal reflexes.  Skin: Skin is warm and dry.  Flesh colored raised lesion on left lower leg  Psychiatric: She has a normal mood and affect. Her behavior is normal. Judgment and thought content normal.    BP (!) 141/79   Pulse 70   Temp 97.1 F (36.2 C) (Oral)   Ht _0  (1.6 m)   Wt 161 lb (73 kg)   BMI 28.52 kg/m      Assessment & Plan:   1. Essential hypertension Low sodium diet - lisinopril (PRINIVIL,ZESTRIL) 20 MG tablet; Take 1 tablet (20 mg total) by mouth daily.  Dispense: 90 tablet; Refill: 1 - CMP14+EGFR  2. Osteoporosis without current pathological fracture, unspecified osteoporosis type Weight bearing exercises encouraged  3. Hyperlipidemia, unspecified hyperlipidemia type Low fat diet - Lipid panel  4. Primary insomnia Bedtime routine  5. BMI 29.0-29.9,adult Discussed diet and exercise for person with BMI >25 Will recheck weight in 3-6 months  6. Leg skin lesion, left cryotherapy    Labs pending Health maintenance reviewed Diet  and exercise encouraged Continue all meds Follow up  In 6 months   [place], FNP

## 2016-10-24 NOTE — Patient Instructions (Signed)
Cryotherapy  WHAT IS CRYOTHERAPY?  Cryotherapy, or cold therapy, is a treatment that uses cold temperatures to treat an injury or medical condition. It includes using cold packs or ice packs to reduce pain and swelling. Only use cryotherapy if your doctor says it is okay.  HOW DO I USE CRYOTHERAPY?   Place a towel between the cold source and your skin.   Apply the cold source for no more than 20 minutes at a time.   Check your skin after 5 minutes to make sure there are no signs of a poor response to cold or skin damage. Check for:  ? White spots on your skin. Your skin may look blotchy or mottled.  ? Skin that looks blue or pale.  ? Skin that feels waxy or hard.   Repeat these steps as many times each day as told by your doctor.    HOW CAN I MAKE A COLD PACK?  When using a cold pack at home to reduce pain and swelling, you can use:   A silica gel cold pack that has been left in the freezer. You can buy this online or in stores.   A plastic bag of frozen vegetables.   A sealable plastic bag that has been filled with crushed ice.    Always wrap the pack in a dry or damp towel to avoid direct contact with your skin.  WHEN SHOULD I CALL MY DOCTOR?  Call your doctor if:   You start to have white spots on your skin. This may give your skin a blotchy or mottled look.   Your skin turns blue or pale.   Your skin becomes waxy or hard.   Your swelling gets worse.    This information is not intended to replace advice given to you by your health care provider. Make sure you discuss any questions you have with your health care provider.  Document Released: 12/11/2007 Document Revised: 11/30/2015 Document Reviewed: 03/08/2015  Elsevier Interactive Patient Education  2017 [employer]

## 2016-10-25 LAB — CMP14+EGFR
ALT: 17 IU/L (ref 0–32)
AST: 21 IU/L (ref 0–40)
Albumin/Globulin Ratio: 1.6 (ref 1.2–2.2)
Albumin: 4.3 g/dL (ref 3.6–4.8)
Alkaline Phosphatase: 99 IU/L (ref 39–117)
BUN/Creatinine Ratio: 15 (ref 12–28)
BUN: 12 mg/dL (ref 8–27)
Bilirubin Total: 0.3 mg/dL (ref 0.0–1.2)
CO2: 25 mmol/L (ref 18–29)
Calcium: 9.4 mg/dL (ref 8.7–10.3)
Chloride: 100 mmol/L (ref 96–106)
Creatinine, Ser: 0.78 mg/dL (ref 0.57–1.00)
GFR calc Af Amer: 94 mL/min/{1.73_m2} (ref >59)
GFR calc non Af Amer: 81 mL/min/{1.73_m2} (ref >59)
Globulin, Total: 2.7 g/dL (ref 1.5–4.5)
Glucose: 97 mg/dL (ref 65–99)
Potassium: 4.2 mmol/L (ref 3.5–5.2)
Sodium: 140 mmol/L (ref 134–144)
Total Protein: 7 g/dL (ref 6.0–8.5)

## 2016-10-25 LAB — LIPID PANEL
Chol/HDL Ratio: 2.3 ratio (ref 0.0–4.4)
Cholesterol, Total: 174 mg/dL (ref 100–199)
HDL: 76 mg/dL (ref >39)
LDL Calculated: 86 mg/dL (ref 0–99)
Triglycerides: 59 mg/dL (ref 0–149)
VLDL Cholesterol Cal: 12 mg/dL (ref 5–40)

## 2017-01-23 ENCOUNTER — Telehealth: Payer: Self-pay | Admitting: Nurse Practitioner

## 2017-01-23 NOTE — Telephone Encounter (Signed)
Scheduled

## 2019-12-17 DIAGNOSIS — B029 Zoster without complications: Secondary | ICD-10-CM | POA: Diagnosis not present

## 2020-07-19 DIAGNOSIS — R059 Cough, unspecified: Secondary | ICD-10-CM | POA: Diagnosis not present

## 2020-07-19 DIAGNOSIS — J029 Acute pharyngitis, unspecified: Secondary | ICD-10-CM | POA: Diagnosis not present

## 2020-07-19 DIAGNOSIS — J0100 Acute maxillary sinusitis, unspecified: Secondary | ICD-10-CM | POA: Diagnosis not present

## 2020-07-25 ENCOUNTER — Ambulatory Visit: Payer: BLUE CROSS/BLUE SHIELD | Admitting: Family Medicine

## 2020-07-28 ENCOUNTER — Ambulatory Visit: Payer: Self-pay | Admitting: Family Medicine

## 2020-08-14 ENCOUNTER — Other Ambulatory Visit: Payer: Self-pay

## 2020-08-14 ENCOUNTER — Encounter: Payer: Self-pay | Admitting: Family Medicine

## 2020-08-14 ENCOUNTER — Ambulatory Visit (INDEPENDENT_AMBULATORY_CARE_PROVIDER_SITE_OTHER): Payer: Medicare HMO | Admitting: Family Medicine

## 2020-08-14 VITALS — BP 148/72 | HR 68 | Temp 98.4°F | Ht 63.0 in | Wt 159.2 lb

## 2020-08-14 DIAGNOSIS — Z1231 Encounter for screening mammogram for malignant neoplasm of breast: Secondary | ICD-10-CM | POA: Diagnosis not present

## 2020-08-14 DIAGNOSIS — I10 Essential (primary) hypertension: Secondary | ICD-10-CM | POA: Diagnosis not present

## 2020-08-14 DIAGNOSIS — Z1211 Encounter for screening for malignant neoplasm of colon: Secondary | ICD-10-CM | POA: Diagnosis not present

## 2020-08-14 DIAGNOSIS — H9192 Unspecified hearing loss, left ear: Secondary | ICD-10-CM

## 2020-08-14 DIAGNOSIS — Z6828 Body mass index (BMI) 28.0-28.9, adult: Secondary | ICD-10-CM

## 2020-08-14 DIAGNOSIS — Z13820 Encounter for screening for osteoporosis: Secondary | ICD-10-CM | POA: Diagnosis not present

## 2020-08-14 DIAGNOSIS — Z7689 Persons encountering health services in other specified circumstances: Secondary | ICD-10-CM | POA: Diagnosis not present

## 2020-08-14 MED ORDER — LISINOPRIL 20 MG PO TABS: 20.0000 mg | ORAL_TABLET | Freq: Every day | ORAL | 1 refills | Status: DC

## 2020-08-14 NOTE — Patient Instructions (Signed)

## 2020-08-14 NOTE — Progress Notes (Signed)
New Patient Office Visit  Subjective:  Patient ID: Dana Gray, female    DOB: 1956-07-03  Age: 65 y.o. MRN: [id number]  CC:  Chief Complaint  Patient presents with  . New Patient (Initial Visit)    HPI DEZERAE Gray presents to establish care. She has not been seen by a PCP for a few years as she has been caring for her husband who has cancer.   1. HTN Complaint with meds - has been out of medication Current Medications - none Checking BP at home ranging: 103-128F systolic Exercising Regularly - she is active Watching Salt intake - Yes Pertinent ROS:  Headache - No Fatigue - No Visual Disturbances - No Chest pain - No Dyspnea - No Palpitations - No LE edema - No  2. Difficulty hearing Adelita reports difficulty hearing out of her left ear. This has gotten worse over the last few months. She feels like her hearing is muffled. Sometimes she can hear but not well enough to understand what is said.   Family, social, and smoking history reviewed.   BP Readings from Last 3 Encounters:  08/14/20 (!) 148/72  10/24/16 (!) 141/79  04/23/16 130/82   CMP Latest Ref Rng & Units 10/24/2016 10/13/2015 10/07/2014  Glucose 65 - 99 mg/dL 97 93 107(H)  BUN 8 - 27 mg/dL _0 Creatinine 0.57 - 1.00 mg/dL 0.78 0.79 0.62  Sodium 134 - 144 mmol/L 140 138 140  Potassium 3.5 - 5.2 mmol/L 4.2 4.4 4.4  Chloride 96 - 106 mmol/L 100 98 103  CO2 18 - 29 mmol/L _1 Calcium 8.7 - 10.3 mg/dL 9.4 9.6 9.2  Total Protein 6.0 - 8.5 g/dL 7.0 7.1 7.0  Total Bilirubin 0.0 - 1.2 mg/dL 0.3 0.3 0.2  Alkaline Phos 39 - 117 IU/L 99 100 116  AST 0 - 40 IU/L _2 ALT 0 - 32 IU/L _3 Past Medical History:  Diagnosis Date  . Chest pain   . HTN (hypertension)     Past Surgical History:  Procedure Laterality Date  . ABDOMINAL HYSTERECTOMY    . SHOULDER SURGERY    . TUBAL LIGATION  1982    Family History  Problem Relation Age of Onset  . Hypertension Mother   .  Arthritis Mother   . Emphysema Father   . COPD Father   . Heart failure Father     Social History   Socioeconomic History  . Marital status: Married    Spouse name: Not on file  . Number of children: Not on file  . Years of education: Not on file  . Highest education level: Not on file  Occupational History  . Not on file  Tobacco Use  . Smoking status: Never Smoker  . Smokeless tobacco: Never Used  Substance and Sexual Activity  . Alcohol use: No  . Drug use: No  . Sexual activity: Not on file  Other Topics Concern  . Not on file  Social History Narrative  . Not on file   Social Determinants of Health   Financial Resource Strain: Not on file  Food Insecurity: Not on file  Transportation Needs: Not on file  Physical Activity: Not on file  Stress: Not on file  Social Connections: Not on file  Intimate Partner Violence: Not on file    ROS Review of Systems Negative unless specially indicated above in HPI.  Objective:  Today's Vitals: BP (!) 148/72   Pulse 68   Temp 98.4 F (36.9 C) (Temporal)   Ht _0  (1.6 m)   Wt 159 lb 4 oz (72.2 kg)   BMI 28.21 kg/m   Physical Exam Vitals and nursing note reviewed.  Constitutional:      General: She is not in acute distress.    Appearance: Normal appearance. She is not ill-appearing.  HENT:     Head: Normocephalic and atraumatic.     Right Ear: Tympanic membrane, ear canal and external ear normal.     Left Ear: Tympanic membrane, ear canal and external ear normal.  Eyes:     Conjunctiva/sclera: Conjunctivae normal.     Pupils: Pupils are equal, round, and reactive to light.  Neck:     Vascular: No carotid bruit.  Cardiovascular:     Rate and Rhythm: Normal rate and regular rhythm.     Pulses: Normal pulses.     Heart sounds: Normal heart sounds. No murmur heard.   Pulmonary:     Effort: Pulmonary effort is normal. No respiratory distress.     Breath sounds: Normal breath sounds.  Abdominal:      General: Bowel sounds are normal. There is no distension.     Palpations: Abdomen is soft. There is no mass.     Tenderness: There is no abdominal tenderness. There is no guarding or rebound.  Musculoskeletal:     Cervical back: Neck supple. No tenderness.     Right lower leg: No edema.     Left lower leg: No edema.  Lymphadenopathy:     Cervical: No cervical adenopathy.  Skin:    General: Skin is warm and dry.     Capillary Refill: Capillary refill takes less than 2 seconds.  Neurological:     General: No focal deficit present.     Mental Status: She is alert and oriented to person, place, and time.     Motor: No weakness.     Gait: Gait normal.  Psychiatric:        Mood and Affect: Mood normal.        Behavior: Behavior normal.     Assessment & Plan:   Camary was seen today for new patient (initial visit).  Diagnoses and all orders for this visit:  Primary hypertension Uncontrolled. Restart lisinopril 20 mg. Discussed goal BP of <130/80s. Patient agrees to notify if BP not at goal. Labs pending as below. Diet and exercise.  -     CBC with Differential/Platelet -     CMP14+EGFR -     Lipid panel -     TSH       -     lisinopril (ZESTRIL) 20 MG tablet; Take 1 tablet (20 mg             total) by mouth daily.  BMI 28.0-28.9,adult Diet and exercise. Labs pending.  -     CBC with Differential/Platelet -     CMP14+EGFR -     Lipid panel -     TSH  Hearing difficulty of left ear Referral placed.  -     Ambulatory referral to Audiology  Encounter for screening mammogram for malignant neoplasm of breast -     MM Digital Screening; Future  Screening for osteoporosis -     DG [facility] DEXA; Future  Screening for colon cancer -     Cologuard  Encounter to establish care Reviewed available records.    Follow-up:  Return in about 3 months (around 11/11/2020) for chronic follow up.   The patient indicates understanding of these issues and agrees with the plan.   Gwenlyn Perking, FNP

## 2020-09-01 ENCOUNTER — Other Ambulatory Visit: Payer: Self-pay

## 2020-09-01 ENCOUNTER — Other Ambulatory Visit: Payer: Medicare HMO

## 2020-09-01 DIAGNOSIS — I10 Essential (primary) hypertension: Secondary | ICD-10-CM | POA: Diagnosis not present

## 2020-09-01 DIAGNOSIS — Z6828 Body mass index (BMI) 28.0-28.9, adult: Secondary | ICD-10-CM | POA: Diagnosis not present

## 2020-09-01 LAB — CBC WITH DIFFERENTIAL/PLATELET
Lymphs: 25 %
MCHC: 32.8 g/dL (ref 31.5–35.7)
Neutrophils: 61 %

## 2020-09-01 LAB — CMP14+EGFR
ALT: 13 IU/L (ref 0–32)
Alkaline Phosphatase: 99 IU/L (ref 44–121)
Bilirubin Total: 0.4 mg/dL (ref 0.0–1.2)
Chloride: 102 mmol/L (ref 96–106)

## 2020-09-01 LAB — LIPID PANEL: Chol/HDL Ratio: 2 ratio (ref 0.0–4.4)

## 2020-09-02 LAB — CMP14+EGFR
AST: 25 IU/L (ref 0–40)
Albumin/Globulin Ratio: 1.8 (ref 1.2–2.2)
Albumin: 4.3 g/dL (ref 3.8–4.8)
BUN/Creatinine Ratio: 13 (ref 12–28)
BUN: 11 mg/dL (ref 8–27)
CO2: 22 mmol/L (ref 20–29)
Calcium: 9.3 mg/dL (ref 8.7–10.3)
Creatinine, Ser: 0.84 mg/dL (ref 0.57–1.00)
GFR calc Af Amer: 84 mL/min/{1.73_m2} (ref >59)
GFR calc non Af Amer: 73 mL/min/{1.73_m2} (ref >59)
Globulin, Total: 2.4 g/dL (ref 1.5–4.5)
Glucose: 93 mg/dL (ref 65–99)
Potassium: 4.5 mmol/L (ref 3.5–5.2)
Sodium: 139 mmol/L (ref 134–144)
Total Protein: 6.7 g/dL (ref 6.0–8.5)

## 2020-09-02 LAB — CBC WITH DIFFERENTIAL/PLATELET
Basophils Absolute: 0.1 10*3/uL (ref 0.0–0.2)
Basos: 1 %
EOS (ABSOLUTE): 0.2 10*3/uL (ref 0.0–0.4)
Eos: 3 %
Hematocrit: 37.8 % (ref 34.0–46.6)
Hemoglobin: 12.4 g/dL (ref 11.1–15.9)
Immature Grans (Abs): 0 10*3/uL (ref 0.0–0.1)
Immature Granulocytes: 0 %
Lymphocytes Absolute: 1.4 10*3/uL (ref 0.7–3.1)
MCH: 27.9 pg (ref 26.6–33.0)
MCV: 85 fL (ref 79–97)
Monocytes Absolute: 0.6 10*3/uL (ref 0.1–0.9)
Monocytes: 10 %
Neutrophils Absolute: 3.4 10*3/uL (ref 1.4–7.0)
Platelets: 318 10*3/uL (ref 150–450)
RBC: 4.44 x10E6/uL (ref 3.77–5.28)
RDW: 12.2 % (ref 11.7–15.4)
WBC: 5.6 10*3/uL (ref 3.4–10.8)

## 2020-09-02 LAB — LIPID PANEL
Cholesterol, Total: 170 mg/dL (ref 100–199)
HDL: 84 mg/dL (ref >39)
LDL Chol Calc (NIH): 77 mg/dL (ref 0–99)
Triglycerides: 43 mg/dL (ref 0–149)
VLDL Cholesterol Cal: 9 mg/dL (ref 5–40)

## 2020-09-02 LAB — TSH: TSH: 3.07 u[IU]/mL (ref 0.450–4.500)

## 2020-10-04 ENCOUNTER — Ambulatory Visit
Admission: RE | Admit: 2020-10-04 | Discharge: 2020-10-04 | Disposition: A | Discharge status: Home / Self Care | Payer: Medicare HMO | Source: Ambulatory Visit | Attending: Family Medicine | Admitting: Family Medicine

## 2020-10-04 ENCOUNTER — Ambulatory Visit (INDEPENDENT_AMBULATORY_CARE_PROVIDER_SITE_OTHER): Payer: Medicare HMO

## 2020-10-04 ENCOUNTER — Other Ambulatory Visit: Payer: Self-pay

## 2020-10-04 DIAGNOSIS — Z1231 Encounter for screening mammogram for malignant neoplasm of breast: Secondary | ICD-10-CM | POA: Diagnosis not present

## 2020-10-04 DIAGNOSIS — M810 Age-related osteoporosis without current pathological fracture: Secondary | ICD-10-CM | POA: Diagnosis not present

## 2020-10-04 DIAGNOSIS — Z780 Asymptomatic menopausal state: Secondary | ICD-10-CM

## 2020-10-04 DIAGNOSIS — Z13820 Encounter for screening for osteoporosis: Secondary | ICD-10-CM

## 2020-10-09 ENCOUNTER — Other Ambulatory Visit: Payer: Self-pay

## 2020-10-09 ENCOUNTER — Ambulatory Visit: Payer: Medicare HMO | Attending: Family Medicine | Admitting: Audiologist

## 2020-10-09 ENCOUNTER — Other Ambulatory Visit: Payer: Self-pay | Admitting: Family Medicine

## 2020-10-09 DIAGNOSIS — H903 Sensorineural hearing loss, bilateral: Secondary | ICD-10-CM | POA: Diagnosis not present

## 2020-10-09 DIAGNOSIS — H906 Mixed conductive and sensorineural hearing loss, bilateral: Secondary | ICD-10-CM

## 2020-10-09 NOTE — Procedures (Signed)
  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  Deeanne is receiving a hearing evaluation due to concerns for significant difficulty hearing. Yanilen has difficulty hearing in her left ear. Gorgeous has been struggling to hear for about a year, but has felt the left ear is worse for the past few months. This difficulty began gradually. No pain or pressure reported in either ear. No tinnitus present in both ears. Ivy has history of ear infections as a child. She says a growth was removed from behind her right pinna a few years ago. There was no incident at the time of the hearing loss onset. She says she cannot hear a siren while driving. She think she may have been lip reading more than she realized before everyone started wearing masks.   Medical history negative for any risk factor for hearing loss. No other relevant case history reported.    Evaluation:   Otoscopy showed a clear view of the tympanic membranes, bilaterally  Tympanometry results were consistent with normal middle ear function in both ears, with some hypercompliance in the left ear  Audiometric testing was completed using conventional audiometry with insert then crosscheck with supraural transducer. Speech Recognition Thresholds were fairly consistent with pure tone averages. Word Recognition was good at an elevated level. Pure tone thresholds show moderate mixed hearing loss in both. Test results are consistent with asymmetric hearing with the left ear sloping to severe loss after 4k Hz.   Results:  The test  results were reviewed with [place]. Her left ear is somewhat worse but both ears have hearing loss. She needs to see an ENT Physician for medical evaluation. After the evaluation she needs a hearing aid consult. She has indeed been reading lips, as her hearing loss is moderate to severe. Luckily her word recognition is still good at an elevated level, making her a hearing aid candidate.   Recommendations: 1. Amplification is nessary for both ears. Hearing aids can be purchased from a variety of locations. See provided list for locations in the [place] area.  2. Referral to ENT Physician necessary due to some asymmetric mixed hearing loss.  3. Harlow Mares FNP, please send referral to [facility] on [address] [facility] [employer] for ENT evaluation and hearing aid consult. Halley says she is a previous patient at the practice.    Ammie Ferrier  Audiologist, Au.D., CCC-A 10/09/2020  1:15 PM  Cc: Gabriel Earing, FNP

## 2020-11-08 DIAGNOSIS — J342 Deviated nasal septum: Secondary | ICD-10-CM | POA: Insufficient documentation

## 2020-11-08 DIAGNOSIS — H903 Sensorineural hearing loss, bilateral: Secondary | ICD-10-CM | POA: Diagnosis not present

## 2020-11-14 ENCOUNTER — Encounter: Payer: Self-pay | Admitting: Family Medicine

## 2020-11-14 ENCOUNTER — Ambulatory Visit (INDEPENDENT_AMBULATORY_CARE_PROVIDER_SITE_OTHER): Payer: Medicare HMO | Admitting: Family Medicine

## 2020-11-14 ENCOUNTER — Other Ambulatory Visit: Payer: Self-pay

## 2020-11-14 VITALS — BP 122/67 | HR 66 | Temp 97.9°F | Ht 63.0 in | Wt 151.5 lb

## 2020-11-14 DIAGNOSIS — I10 Essential (primary) hypertension: Secondary | ICD-10-CM

## 2020-11-14 DIAGNOSIS — M810 Age-related osteoporosis without current pathological fracture: Secondary | ICD-10-CM | POA: Diagnosis not present

## 2020-11-14 DIAGNOSIS — N644 Mastodynia: Secondary | ICD-10-CM | POA: Diagnosis not present

## 2020-11-14 MED ORDER — OSCAL 500/200 D-3 500-200 MG-UNIT PO TABS
2.0000 | ORAL_TABLET | Freq: Every day | ORAL | 0 refills | Status: DC
Start: 2020-11-14 — End: 2023-03-17

## 2020-11-14 MED ORDER — LISINOPRIL 20 MG PO TABS: 20.0000 mg | ORAL_TABLET | Freq: Every day | ORAL | 1 refills | Status: DC

## 2020-11-14 MED ORDER — ALENDRONATE SODIUM 70 MG PO TABS: 70.0000 mg | ORAL_TABLET | ORAL | 0 refills | Status: DC

## 2020-11-14 NOTE — Patient Instructions (Addendum)
Fibrocystic Breast Changes  Fibrocystic breast changes are changes that can make your breasts swollen or painful. These changes happen when there is scar-like tissue (fibrous tissue) in the breasts or tiny sacs of fluid (cysts) form in the breast. This is a common condition. It does not mean that you have cancer. It usually happens because of hormone changes during a monthly period. What are the causes? The exact cause of this condition is not known. However, you are more likely to have it:  If you have high levels of female hormones.  If your mother had the same condition (inherited). What are the signs or symptoms? Symptoms of this condition include:  Tenderness, swelling, mild discomfort, or pain.  Rope-like tissue that can be felt when touching the breast.  Lumps in one or both breasts.  Changes in breast size. Breasts may get larger before your period and smaller after your period.  Discharge from the nipple. How is this treated? In many cases, there is no treatment for this condition. In some cases, you may need treatment, including:  Taking medicines.  Avoiding caffeine.  Reducing the amount of sugar and fat in your diet. You may also have:  A procedure to remove fluid from a cyst.  Surgery to remove a cyst that is large or tender or does not go away.  Medicines that may lower female hormones in the body. Follow these instructions at home: Self care Check your breasts after every monthly period. If you do not have monthly periods, check your breasts on the first day of every month. Check for:  Soreness.  New swelling or puffiness.  A change in breast size.  A change in a lump that was already there. General instructions  Take over-the-counter and prescription medicines only as told by your doctor.  Wear a support or sports bra that fits well. Wear this support especially when you are exercising.  If told by your doctor, avoid or have less caffeine, fat, and  sugar in what you eat and drink.  Keep all follow-up visits as told by your doctor. This is important. Contact a doctor if:  You have fluid coming from your nipple, especially if the fluid has blood in it.  You have new lumps or bumps in your breast.  Your breast gets puffy, red, and painful.  You have changes in how your breast looks.  Your nipple looks flat or it sinks into your breast. Get help right away if:  Your breast turns red, and the redness is spreading. Summary  Fibrocystic breast changes are changes that can make your breasts swollen or painful.  This condition can happen when you have hormone changes during your monthly period.  Check your breasts after every monthly period. If you do not have monthly periods, check your breasts on the first day of every month. This information is not intended to replace advice given to you by your health care provider. Make sure you discuss any questions you have with your health care provider. Document Revised: 06/07/2019 Document Reviewed: 06/07/2019 Elsevier Patient Education  2021 [place].   Osteoporosis  Osteoporosis happens when the bones become thin and less dense than normal. Osteoporosis makes bones more brittle and fragile and more likely to break (fracture). Over time, osteoporosis can cause your bones to become so weak that they fracture after a minor fall. Bones in the hip, wrist, and spine are most likely to fracture due to osteoporosis. What are the causes? The exact cause of  this condition is not known. What increases the risk? You are more likely to develop this condition if you: Have family members with this condition. Have poor nutrition. Use the following: Steroid medicines, such as prednisone. Anti-seizure medicines. Nicotine or tobacco, such as cigarettes, e-cigarettes, and chewing tobacco. Are female. Are age 20 or older. Are not physically active (are sedentary). Are of European or Asian  descent. Have a small body frame. What are the signs or symptoms? A fracture might be the first sign of osteoporosis, especially if the fracture results from a fall or injury that usually would not cause a bone to break. Other signs and symptoms include: Pain in the neck or low back. Stooped posture. Loss of height. How is this diagnosed? This condition may be diagnosed based on: Your medical history. A physical exam. A bone mineral density test, also called a DXA or DEXA test (dual-energy X-ray absorptiometry test). This test uses X-rays to measure the amount of minerals in your bones. How is this treated? This condition may be treated by: Making lifestyle changes, such as: Including foods with more calcium and vitamin D in your diet. Doing weight-bearing and muscle-strengthening exercises. Stopping tobacco use. Limiting alcohol intake. Taking medicine to slow the process of bone loss or to increase bone density. Taking daily supplements of calcium and vitamin D. Taking hormone replacement medicines, such as estrogen for women and testosterone for men. Monitoring your levels of calcium and vitamin D. The goal of treatment is to strengthen your bones and lower your risk for a fracture. Follow these instructions at home: Eating and drinking Include calcium and vitamin D in your diet. Calcium is important for bone health, and vitamin D helps your body absorb calcium. Good sources of calcium and vitamin D include: Certain fatty fish, such as salmon and tuna. Products that have calcium and vitamin D added to them (are fortified), such as fortified cereals. Egg yolks. Cheese. Liver.   Activity Do exercises as told by your health care provider. Ask your health care provider what exercises and activities are safe for you. You should do: Exercises that make you work against gravity (weight-bearing exercises), such as tai chi, yoga, or walking. Exercises to strengthen muscles, such as  lifting weights. Lifestyle Do not drink alcohol if: Your health care provider tells you not to drink. You are pregnant, may be pregnant, or are planning to become pregnant. If you drink alcohol: Limit how much you use to: 0-1 drink a day for women. 0-2 drinks a day for men. Know how much alcohol is in your drink. In the [place] one drink equals one 12 oz bottle of beer (355 mL), one 5 oz glass of wine (148 mL), or one 1 oz glass of hard liquor (44 mL). Do not use any products that contain nicotine or tobacco, such as cigarettes, e-cigarettes, and chewing tobacco. If you need help quitting, ask your health care provider. Preventing falls Use devices to help you move around (mobility aids) as needed, such as canes, walkers, scooters, or crutches. Keep rooms well-lit and clutter-free. Remove tripping hazards from walkways, including cords and throw rugs. Install grab bars in bathrooms and safety rails on stairs. Use rubber mats in the bathroom and other areas that are often wet or slippery. Wear closed-toe shoes that fit well and support your feet. Wear shoes that have rubber soles or low heels. Review your medicines with your health care provider. Some medicines can cause dizziness or changes in blood pressure, which can  increase your risk of falling. General instructions Take over-the-counter and prescription medicines only as told by your health care provider. Keep all follow-up visits. This is important. Contact a health care provider if: You have never been screened for osteoporosis and you are: A woman who is age 24 or older. A man who is age 66 or older. Get help right away if: You fall or injure yourself. Summary Osteoporosis is thinning and loss of density in your bones. This makes bones more brittle and fragile and more likely to break (fracture),even with minor falls. The goal of treatment is to strengthen your bones and lower your risk for a fracture. Include calcium and  vitamin D in your diet. Calcium is important for bone health, and vitamin D helps your body absorb calcium. Talk with your health care provider about screening for osteoporosis if you are a woman who is age 10 or older, or a man who is age 110 or older. This information is not intended to replace advice given to you by your health care provider. Make sure you discuss any questions you have with your health care provider. Document Revised: 12/09/2019 Document Reviewed: 12/09/2019 Elsevier Patient Education  [place].

## 2020-11-14 NOTE — Progress Notes (Signed)
Established Patient Office Visit  Subjective:  Patient ID: Dana Gray, female    DOB: 1956/03/01  Age: 65 y.o. MRN: [id number]  CC:  Chief Complaint  Patient presents with  . Hypertension    HPI Dana Gray presents for HTN.  1. HTN Complaint with meds - forgets a few days a week Current Medications - lisinopril 20 mg Checking BP at home ranging: 120-130s Exercising Regularly - has increased daily activity lately Watching Salt intake - Yes Pertinent ROS:  Headache - No Fatigue - No Visual Disturbances - No Chest pain - No Dyspnea - No Palpitations - No LE edema - No They report good compliance with medications and can restate their regimen by memory. No medication side effects.  Family, social, and smoking history reviewed.   BP Readings from Last 3 Encounters:  11/14/20 122/67  08/14/20 (!) 148/72  10/24/16 (!) 141/79   CMP Latest Ref Rng & Units 09/01/2020 10/24/2016 10/13/2015  Glucose 65 - 99 mg/dL 93 97 93  BUN 8 - 27 mg/dL _0 Creatinine 0.57 - 1.00 mg/dL 0.84 0.78 0.79  Sodium 134 - 144 mmol/L 139 140 138  Potassium 3.5 - 5.2 mmol/L 4.5 4.2 4.4  Chloride 96 - 106 mmol/L 102 100 98  CO2 20 - 29 mmol/L _1 Calcium 8.7 - 10.3 mg/dL 9.3 9.4 9.6  Total Protein 6.0 - 8.5 g/dL 6.7 7.0 7.1  Total Bilirubin 0.0 - 1.2 mg/dL 0.4 0.3 0.3  Alkaline Phos 44 - 121 IU/L 99 99 100  AST 0 - 40 IU/L _2 ALT 0 - 32 IU/L _3 2. Osteoporosis Reports taking fosamax years ago but stopped due to cost. She has not taken fosamax in at least 2-3 years. She does not take a daily calcium or vitamin D supplement. She would like to take fosamax again if the cost is not too much.   3. Left breast pain Dana Gray report intermittent pain in her left breast. It will hurt for a few days and then stop hurting. She does not do self breat exams. She denies nipple discharge, nipple retraction, or changes in her skin. She reports that one sister had breast cancer and  a few other sisters have has cysts removed. She had a mammogram done on 10/04/20 that was negative.   Past Medical History:  Diagnosis Date  . Chest pain   . HTN (hypertension)     Past Surgical History:  Procedure Laterality Date  . ABDOMINAL HYSTERECTOMY    . SHOULDER SURGERY    . TUBAL LIGATION  1982    Family History  Problem Relation Age of Onset  . Hypertension Mother   . Arthritis Mother   . Emphysema Father   . COPD Father   . Heart failure Father   . Breast cancer Sister     Social History   Socioeconomic History  . Marital status: Married    Spouse name: Not on file  . Number of children: Not on file  . Years of education: Not on file  . Highest education level: Not on file  Occupational History  . Not on file  Tobacco Use  . Smoking status: Never Smoker  . Smokeless tobacco: Never Used  Substance and Sexual Activity  . Alcohol use: No  . Drug use: No  . Sexual activity: Not on file  Other Topics Concern  . Not on file  Social History Narrative  .  Not on file   Social Determinants of Health   Financial Resource Strain: Not on file  Food Insecurity: Not on file  Transportation Needs: Not on file  Physical Activity: Not on file  Stress: Not on file  Social Connections: Not on file  Intimate Partner Violence: Not on file    Outpatient Medications Prior to Visit  Medication Sig Dispense Refill  . lisinopril (ZESTRIL) 20 MG tablet Take 1 tablet (20 mg total) by mouth daily. 90 tablet 1   No facility-administered medications prior to visit.    No Known Allergies  ROS Review of Systems Negative unless specially indicated above in HPI.   Objective:    Physical Exam Vitals and nursing note reviewed.  Constitutional:      General: She is not in acute distress.    Appearance: Normal appearance. She is not ill-appearing.  HENT:     Head: Normocephalic and atraumatic.  Eyes:     Conjunctiva/sclera: Conjunctivae normal.     Pupils: Pupils  are equal, round, and reactive to light.  Neck:     Vascular: No carotid bruit.  Cardiovascular:     Rate and Rhythm: Normal rate and regular rhythm.     Heart sounds: Normal heart sounds. No murmur heard.   Pulmonary:     Effort: Pulmonary effort is normal. No respiratory distress.     Breath sounds: Normal breath sounds.  Chest:  Breasts:     Right: No swelling, bleeding, inverted nipple, mass, nipple discharge, skin change, tenderness, axillary adenopathy or supraclavicular adenopathy.     Left: No swelling, bleeding, inverted nipple, mass, nipple discharge, skin change, tenderness, axillary adenopathy or supraclavicular adenopathy.    Musculoskeletal:     Cervical back: Neck supple. No tenderness.     Right lower leg: No edema.     Left lower leg: No edema.  Lymphadenopathy:     Cervical: No cervical adenopathy.     Upper Body:     Right upper body: No supraclavicular, axillary or pectoral adenopathy.     Left upper body: No supraclavicular, axillary or pectoral adenopathy.  Skin:    General: Skin is warm and dry.     Capillary Refill: Capillary refill takes less than 2 seconds.  Neurological:     General: No focal deficit present.     Mental Status: She is alert and oriented to person, place, and time.     Motor: No weakness.     Gait: Gait normal.  Psychiatric:        Mood and Affect: Mood normal.        Behavior: Behavior normal.     BP 122/67   Pulse 66   Temp 97.9 F (36.6 C) (Temporal)   Ht _0  (1.6 m)   Wt 151 lb 8 oz (68.7 kg)   BMI 26.84 kg/m  Wt Readings from Last 3 Encounters:  11/14/20 151 lb 8 oz (68.7 kg)  08/14/20 159 lb 4 oz (72.2 kg)  10/24/16 161 lb (73 kg)     Health Maintenance Due  Topic Date Due  . COLON CANCER SCREENING ANNUAL FOBT  Never done  . COLONOSCOPY (Pts 45-2yr Insurance coverage will need to be confirmed)  Never done    There are no preventive care reminders to display for this patient.  Lab Results  Component  Value Date   TSH 3.070 09/01/2020   Lab Results  Component Value Date   WBC 5.6 09/01/2020   HGB 12.4 09/01/2020  HCT 37.8 09/01/2020   MCV 85 09/01/2020   PLT 318 09/01/2020   Lab Results  Component Value Date   NA 139 09/01/2020   K 4.5 09/01/2020   CO2 22 09/01/2020   GLUCOSE 93 09/01/2020   BUN 11 09/01/2020   CREATININE 0.84 09/01/2020   BILITOT 0.4 09/01/2020   ALKPHOS 99 09/01/2020   AST 25 09/01/2020   ALT 13 09/01/2020   PROT 6.7 09/01/2020   ALBUMIN 4.3 09/01/2020   CALCIUM 9.3 09/01/2020   GFR 93.05 03/07/2011   Lab Results  Component Value Date   CHOL 170 09/01/2020   Lab Results  Component Value Date   HDL 84 09/01/2020   Lab Results  Component Value Date   LDLCALC 77 09/01/2020   Lab Results  Component Value Date   TRIG 43 09/01/2020   Lab Results  Component Value Date   CHOLHDL 2.0 09/01/2020   No results found for: HGBA1C    Assessment & Plan:   Anajah was seen today for hypertension.  Diagnoses and all orders for this visit:  Primary hypertension Well controlled on current regimen. Labs pending.  -     lisinopril (ZESTRIL) 20 MG tablet; Take 1 tablet (20 mg total) by mouth daily. -     CBC with Differential/Platelet -     CMP14+EGFR -     Lipid panel  Age-related osteoporosis without current pathological fracture Start oscal supplement. Start fosamax as below. Labs pending.  -     calcium-vitamin D (OSCAL 500/200 D-3) 500-200 MG-UNIT tablet; Take 2 tablets by mouth daily with breakfast. -     alendronate (FOSAMAX) 70 MG tablet; Take 1 tablet (70 mg total) by mouth once a week. Take with a full glass of water on an empty stomach. -     VITAMIN D 25 Hydroxy (Vit-D Deficiency, Fractures)  Breast pain, left Normal breast exam today. Negative mammogram on 10/04/20. Discussed possibility of fibrocystic breast causing pain. Handout given.Return to office for new or worsening symptoms, or if symptoms persist.    Follow-up: Return in  about 6 months (around 05/17/2021) for chronic follow up.   The patient indicates understanding of these issues and agrees with the plan.    Gwenlyn Perking, FNP

## 2020-11-15 LAB — CMP14+EGFR
ALT: 21 IU/L (ref 0–32)
AST: 23 IU/L (ref 0–40)
Albumin/Globulin Ratio: 1.8 (ref 1.2–2.2)
Albumin: 4.6 g/dL (ref 3.8–4.8)
Alkaline Phosphatase: 118 IU/L (ref 44–121)
BUN/Creatinine Ratio: 16 (ref 12–28)
BUN: 13 mg/dL (ref 8–27)
Bilirubin Total: 0.2 mg/dL (ref 0.0–1.2)
CO2: 24 mmol/L (ref 20–29)
Calcium: 9.6 mg/dL (ref 8.7–10.3)
Chloride: 103 mmol/L (ref 96–106)
Creatinine, Ser: 0.8 mg/dL (ref 0.57–1.00)
Globulin, Total: 2.5 g/dL (ref 1.5–4.5)
Glucose: 73 mg/dL (ref 65–99)
Potassium: 5.7 mmol/L — ABNORMAL HIGH (ref 3.5–5.2)
Sodium: 141 mmol/L (ref 134–144)
Total Protein: 7.1 g/dL (ref 6.0–8.5)
eGFR: 81 mL/min/{1.73_m2} (ref >59)

## 2020-11-15 LAB — CBC WITH DIFFERENTIAL/PLATELET
Basophils Absolute: 0.1 10*3/uL (ref 0.0–0.2)
Basos: 1 %
EOS (ABSOLUTE): 0.2 10*3/uL (ref 0.0–0.4)
Eos: 2 %
Hematocrit: 38.8 % (ref 34.0–46.6)
Hemoglobin: 12.4 g/dL (ref 11.1–15.9)
Immature Grans (Abs): 0 10*3/uL (ref 0.0–0.1)
Immature Granulocytes: 0 %
Lymphocytes Absolute: 1.5 10*3/uL (ref 0.7–3.1)
Lymphs: 18 %
MCH: 27.1 pg (ref 26.6–33.0)
MCHC: 32 g/dL (ref 31.5–35.7)
MCV: 85 fL (ref 79–97)
Monocytes Absolute: 0.9 10*3/uL (ref 0.1–0.9)
Monocytes: 10 %
Neutrophils Absolute: 5.8 10*3/uL (ref 1.4–7.0)
Neutrophils: 69 %
Platelets: 351 10*3/uL (ref 150–450)
RBC: 4.58 x10E6/uL (ref 3.77–5.28)
RDW: 13.1 % (ref 11.7–15.4)
WBC: 8.4 10*3/uL (ref 3.4–10.8)

## 2020-11-15 LAB — LIPID PANEL
Chol/HDL Ratio: 2.1 ratio (ref 0.0–4.4)
Cholesterol, Total: 198 mg/dL (ref 100–199)
HDL: 93 mg/dL (ref >39)
LDL Chol Calc (NIH): 93 mg/dL (ref 0–99)
Triglycerides: 66 mg/dL (ref 0–149)
VLDL Cholesterol Cal: 12 mg/dL (ref 5–40)

## 2020-11-15 LAB — VITAMIN D 25 HYDROXY (VIT D DEFICIENCY, FRACTURES): Vit D, 25-Hydroxy: 22.8 ng/mL — ABNORMAL LOW (ref 30.0–100.0)

## 2020-11-16 ENCOUNTER — Other Ambulatory Visit: Payer: Self-pay | Admitting: *Deleted

## 2020-11-16 ENCOUNTER — Other Ambulatory Visit: Payer: Self-pay | Admitting: Family Medicine

## 2020-11-16 DIAGNOSIS — E559 Vitamin D deficiency, unspecified: Secondary | ICD-10-CM

## 2020-11-16 DIAGNOSIS — E875 Hyperkalemia: Secondary | ICD-10-CM

## 2020-11-16 MED ORDER — VITAMIN D (ERGOCALCIFEROL) 1.25 MG (50000 UNIT) PO CAPS: 50000.0000 [IU] | ORAL_CAPSULE | ORAL | 0 refills | Status: DC

## 2020-11-24 ENCOUNTER — Other Ambulatory Visit: Payer: Self-pay | Admitting: Family Medicine

## 2020-11-24 ENCOUNTER — Other Ambulatory Visit: Payer: Self-pay

## 2020-11-24 ENCOUNTER — Ambulatory Visit: Payer: Medicare HMO

## 2020-11-24 DIAGNOSIS — I10 Essential (primary) hypertension: Secondary | ICD-10-CM

## 2020-11-24 DIAGNOSIS — E875 Hyperkalemia: Secondary | ICD-10-CM | POA: Diagnosis not present

## 2020-11-24 LAB — BMP8+EGFR
BUN/Creatinine Ratio: 18 (ref 12–28)
BUN: 15 mg/dL (ref 8–27)
CO2: 22 mmol/L (ref 20–29)
Calcium: 9.6 mg/dL (ref 8.7–10.3)
Chloride: 101 mmol/L (ref 96–106)
Creatinine, Ser: 0.82 mg/dL (ref 0.57–1.00)
Glucose: 111 mg/dL — ABNORMAL HIGH (ref 65–99)
Potassium: 5.2 mmol/L (ref 3.5–5.2)
Sodium: 138 mmol/L (ref 134–144)
eGFR: 78 mL/min/{1.73_m2} (ref >59)

## 2020-11-24 NOTE — Progress Notes (Signed)
BP at goal. Have patient continue lisinopril 10 mg daily.

## 2020-11-24 NOTE — Progress Notes (Signed)
Patient here today for blood pressure check and repeat BMP.  Patient's blood pressure today was 126/75, pulse 65.

## 2020-12-06 ENCOUNTER — Other Ambulatory Visit: Payer: Self-pay | Admitting: Family Medicine

## 2020-12-06 ENCOUNTER — Ambulatory Visit (INDEPENDENT_AMBULATORY_CARE_PROVIDER_SITE_OTHER): Payer: Medicare HMO

## 2020-12-06 VITALS — Ht 63.0 in | Wt 151.0 lb

## 2020-12-06 DIAGNOSIS — Z0000 Encounter for general adult medical examination without abnormal findings: Secondary | ICD-10-CM | POA: Diagnosis not present

## 2020-12-06 NOTE — Progress Notes (Signed)
I have reviewed the AWV documentation and agree with the written assessment and plan of care.  Elanda Garmany, FNP-C [facility]  

## 2020-12-06 NOTE — Patient Instructions (Signed)
Dana Gray , Thank you for taking time to come for your Medicare Wellness Visit. I appreciate your ongoing commitment to your health goals. Please review the following plan we discussed and let me know if I can assist you in the future.   Screening recommendations/referrals: Colonoscopy: Cologuard was ordered - waiting on shipment Mammogram: Done 10/04/2020 - Repeat annually Bone Density: Done 10/04/2020 - Repeat every 2 years Recommended yearly ophthalmology/optometry visit for glaucoma screening and checkup Recommended yearly dental visit for hygiene and checkup  Vaccinations: Influenza vaccine: Due every fall Pneumococcal vaccine: Due Tdap vaccine: Done 02/15/2014 - Repeat in 10 years Shingles vaccine: Shingrix discussed. Please contact your pharmacy for coverage information.    Covid-19: Due  Advanced directives: Advance directive discussed with you today. I have provided a copy for you to complete at home and have notarized. Once this is complete please bring a copy in to our office so we can scan it into your chart.  Conditions/risks identified: Aim for 30 minutes of exercise or brisk walking each day and add weight bearing exercises to increase bone strength, drink 6-8 glasses of water and eat lots of fruits and vegetables.  Next appointment: Follow up in one year for your annual wellness visit    Preventive Care 65 Years and Older, Female Preventive care refers to lifestyle choices and visits with your health care provider that can promote health and wellness. What does preventive care include?  A yearly physical exam. This is also called an annual well check.  Dental exams once or twice a year.  Routine eye exams. Ask your health care provider how often you should have your eyes checked.  Personal lifestyle choices, including:  Daily care of your teeth and gums.  Regular physical activity.  Eating a healthy diet.  Avoiding tobacco and drug use.  Limiting alcohol  use.  Practicing safe sex.  Taking low-dose aspirin every day.  Taking vitamin and mineral supplements as recommended by your health care provider. What happens during an annual well check? The services and screenings done by your health care provider during your annual well check will depend on your age, overall health, lifestyle risk factors, and family history of disease. Counseling  Your health care provider may ask you questions about your:  Alcohol use.  Tobacco use.  Drug use.  Emotional well-being.  Home and relationship well-being.  Sexual activity.  Eating habits.  History of falls.  Memory and ability to understand (cognition).  Work and work Astronomer.  Reproductive health. Screening  You may have the following tests or measurements:  Height, weight, and BMI.  Blood pressure.  Lipid and cholesterol levels. These may be checked every 5 years, or more frequently if you are over 63 years old.  Skin check.  Lung cancer screening. You may have this screening every year starting at age 30 if you have a 30-pack-year history of smoking and currently smoke or have quit within the past 15 years.  Fecal occult blood test (FOBT) of the stool. You may have this test every year starting at age 82.  Flexible sigmoidoscopy or colonoscopy. You may have a sigmoidoscopy every 5 years or a colonoscopy every 10 years starting at age 61.  Hepatitis C blood test.  Hepatitis B blood test.  Sexually transmitted disease (STD) testing.  Diabetes screening. This is done by checking your blood sugar (glucose) after you have not eaten for a while (fasting). You may have this done every 1-3 years.  Bone density  scan. This is done to screen for osteoporosis. You may have this done starting at age 84.  Mammogram. This may be done every 1-2 years. Talk to your health care provider about how often you should have regular mammograms. Talk with your health care provider about  your test results, treatment options, and if necessary, the need for more tests. Vaccines  Your health care provider may recommend certain vaccines, such as:  Influenza vaccine. This is recommended every year.  Tetanus, diphtheria, and acellular pertussis (Tdap, Td) vaccine. You may need a Td booster every 10 years.  Zoster vaccine. You may need this after age 30.  Pneumococcal 13-valent conjugate (PCV13) vaccine. One dose is recommended after age 40.  Pneumococcal polysaccharide (PPSV23) vaccine. One dose is recommended after age 88. Talk to your health care provider about which screenings and vaccines you need and how often you need them. This information is not intended to replace advice given to you by your health care provider. Make sure you discuss any questions you have with your health care provider. Document Released: 07/21/2015 Document Revised: 03/13/2016 Document Reviewed: 04/25/2015 Elsevier Interactive Patient Education  2017 [employer] Prevention in the Home Falls can cause injuries. They can happen to people of all ages. There are many things you can do to make your home safe and to help prevent falls. What can I do on the outside of my home?  Regularly fix the edges of walkways and driveways and fix any cracks.  Remove anything that might make you trip as you walk through a door, such as a raised step or threshold.  Trim any bushes or trees on the path to your home.  Use bright outdoor lighting.  Clear any walking paths of anything that might make someone trip, such as rocks or tools.  Regularly check to see if handrails are loose or broken. Make sure that both sides of any steps have handrails.  Any raised decks and porches should have guardrails on the edges.  Have any leaves, snow, or ice cleared regularly.  Use sand or salt on walking paths during winter.  Clean up any spills in your garage right away. This includes oil or grease spills. What  can I do in the bathroom?  Use night lights.  Install grab bars by the toilet and in the tub and shower. Do not use towel bars as grab bars.  Use non-skid mats or decals in the tub or shower.  If you need to sit down in the shower, use a plastic, non-slip stool.  Keep the floor dry. Clean up any water that spills on the floor as soon as it happens.  Remove soap buildup in the tub or shower regularly.  Attach bath mats securely with double-sided non-slip rug tape.  Do not have throw rugs and other things on the floor that can make you trip. What can I do in the bedroom?  Use night lights.  Make sure that you have a light by your bed that is easy to reach.  Do not use any sheets or blankets that are too big for your bed. They should not hang down onto the floor.  Have a firm chair that has side arms. You can use this for support while you get dressed.  Do not have throw rugs and other things on the floor that can make you trip. What can I do in the kitchen?  Clean up any spills right away.  Avoid walking on wet  floors.  Keep items that you use a lot in easy-to-reach places.  If you need to reach something above you, use a strong step stool that has a grab bar.  Keep electrical cords out of the way.  Do not use floor polish or wax that makes floors slippery. If you must use wax, use non-skid floor wax.  Do not have throw rugs and other things on the floor that can make you trip. What can I do with my stairs?  Do not leave any items on the stairs.  Make sure that there are handrails on both sides of the stairs and use them. Fix handrails that are broken or loose. Make sure that handrails are as long as the stairways.  Check any carpeting to make sure that it is firmly attached to the stairs. Fix any carpet that is loose or worn.  Avoid having throw rugs at the top or bottom of the stairs. If you do have throw rugs, attach them to the floor with carpet tape.  Make sure  that you have a light switch at the top of the stairs and the bottom of the stairs. If you do not have them, ask someone to add them for you. What else can I do to help prevent falls?  Wear shoes that:  Do not have high heels.  Have rubber bottoms.  Are comfortable and fit you well.  Are closed at the toe. Do not wear sandals.  If you use a stepladder:  Make sure that it is fully opened. Do not climb a closed stepladder.  Make sure that both sides of the stepladder are locked into place.  Ask someone to hold it for you, if possible.  Clearly mark and make sure that you can see:  Any grab bars or handrails.  First and last steps.  Where the edge of each step is.  Use tools that help you move around (mobility aids) if they are needed. These include:  Canes.  Walkers.  Scooters.  Crutches.  Turn on the lights when you go into a dark area. Replace any light bulbs as soon as they burn out.  Set up your furniture so you have a clear path. Avoid moving your furniture around.  If any of your floors are uneven, fix them.  If there are any pets around you, be aware of where they are.  Review your medicines with your doctor. Some medicines can make you feel dizzy. This can increase your chance of falling. Ask your doctor what other things that you can do to help prevent falls. This information is not intended to replace advice given to you by your health care provider. Make sure you discuss any questions you have with your health care provider. Document Released: 04/20/2009 Document Revised: 11/30/2015 Document Reviewed: 07/29/2014 Elsevier Interactive Patient Education  2017 [employer].

## 2020-12-06 NOTE — Progress Notes (Signed)
Subjective:   Dana Gray is a 65 y.o. female who presents for an Initial Medicare Annual Wellness Visit.  Virtual Visit via Telephone Note  I connected with  Seleta Rhymes on 12/06/20 at  9:00 AM EDT by telephone and verified that I am speaking with the correct person using two identifiers.  Location: Patient: Home Provider: [facility] Persons participating in the virtual visit: patient/Nurse Health Advisor   I discussed the limitations, risks, security and privacy concerns of performing an evaluation and management service by telephone and the availability of in person appointments. The patient expressed understanding and agreed to proceed.  Interactive audio and video telecommunications were attempted between this nurse and patient, however failed, due to patient having technical difficulties OR patient did not have access to video capability.  We continued and completed visit with audio only.  Some vital signs may be absent or patient reported.   Jadie Comas E Presli Fanguy, LPN   Review of Systems     Cardiac Risk Factors include: advanced age (>31men, >33 women);hypertension     Objective:    Today's Vitals   12/06/20 0925  Weight: 151 lb (68.5 kg)  Height: 5\' 3"  (1.6 m)   Body mass index is 26.75 kg/m.  Advanced Directives 12/06/2020  Does Patient Have a Medical Advance Directive? No  Would patient like information on creating a medical advance directive? Yes (MAU/Ambulatory/Procedural Areas - Information given)    Current Medications (verified) Outpatient Encounter Medications as of 12/06/2020  Medication Sig  . alendronate (FOSAMAX) 70 MG tablet Take 1 tablet (70 mg total) by mouth once a week. Take with a full glass of water on an empty stomach.  . calcium-vitamin D (OSCAL 500/200 D-3) 500-200 MG-UNIT tablet Take 2 tablets by mouth daily with breakfast.  . lisinopril (ZESTRIL) 20 MG tablet Take 1 tablet (20 mg total) by mouth daily.  . Vitamin D, Ergocalciferol, (DRISDOL)  1.25 MG (50000 UNIT) CAPS capsule Take 1 capsule (50,000 Units total) by mouth every 7 (seven) days.   No facility-administered encounter medications on file as of 12/06/2020.    Allergies (verified) Patient has no known allergies.   History: Past Medical History:  Diagnosis Date  . Chest pain   . HTN (hypertension)    Past Surgical History:  Procedure Laterality Date  . ABDOMINAL HYSTERECTOMY    . SHOULDER SURGERY    . TUBAL LIGATION  1982   Family History  Problem Relation Age of Onset  . Hypertension Mother   . Arthritis Mother   . Emphysema Father   . COPD Father   . Heart failure Father   . Breast cancer Sister    Social History   Socioeconomic History  . Marital status: Married    Spouse name: Not on file  . Number of children: Not on file  . Years of education: Not on file  . Highest education level: Not on file  Occupational History  . Not on file  Tobacco Use  . Smoking status: Never Smoker  . Smokeless tobacco: Never Used  Substance and Sexual Activity  . Alcohol use: No  . Drug use: No  . Sexual activity: Not on file  Other Topics Concern  . Not on file  Social History Narrative   Lives home with husband. Children live nearby   Social Determinants of Health   Financial Resource Strain: Low Risk   . Difficulty of Paying Living Expenses: Not hard at all  Food Insecurity: No Food Insecurity  .  Worried About Programme researcher, broadcasting/film/video in the Last Year: Never true  . Ran Out of Food in the Last Year: Never true  Transportation Needs: No Transportation Needs  . Lack of Transportation (Medical): No  . Lack of Transportation (Non-Medical): No  Physical Activity: Sufficiently Active  . Days of Exercise per Week: 7 days  . Minutes of Exercise per Session: 30 min  Stress: No Stress Concern Present  . Feeling of Stress : Not at all  Social Connections: Moderately Isolated  . Frequency of Communication with Friends and Family: More than three times a week  .  Frequency of Social Gatherings with Friends and Family: More than three times a week  . Attends Religious Services: Never  . Active Member of Clubs or Organizations: No  . Attends Banker Meetings: Never  . Marital Status: Married    Tobacco Counseling Counseling given: Not Answered   Clinical Intake:  Pre-visit preparation completed: Yes  Pain : No/denies pain     BMI - recorded: 26.75 Nutritional Status: BMI 25 -29 Overweight Nutritional Risks: None Diabetes: No  How often do you need to have someone help you when you read instructions, pamphlets, or other written materials from your doctor or pharmacy?: 1 - Never  Diabetic? no  Interpreter Needed?: No  Information entered by :: Keon Pender, LPN   Activities of Daily Living In your present state of health, do you have any difficulty performing the following activities: 12/06/2020  Hearing? Y  Vision? N  Difficulty concentrating or making decisions? Y  Walking or climbing stairs? N  Dressing or bathing? N  Doing errands, shopping? N  Preparing Food and eating ? N  Using the Toilet? N  In the past six months, have you accidently leaked urine? N  Do you have problems with loss of bowel control? N  Managing your Medications? N  Managing your Finances? N  Housekeeping or managing your Housekeeping? N  Some recent data might be hidden    Patient Care Team: Gabriel Earing, FNP as PCP - General (Family Medicine)  Indicate any recent Medical Services you may have received from other than [facility] providers in the past year (date may be approximate).     Assessment:   This is a routine wellness examination for Dana Gray.  Hearing/Vision screen  Hearing Screening   125Hz  250Hz  500Hz  1000Hz  2000Hz  3000Hz  4000Hz  6000Hz  8000Hz   Right ear:           Left ear:           Comments: Recently had hearing checked and hearing aids were recommended - she will likely get them soon - c/o moderate hearing  loss.  Vision Screening Comments: Denies vision difficulties - no eye doctor  Dietary issues and exercise activities discussed: Current Exercise Habits: Home exercise routine, Type of exercise: walking, Time (Minutes): 30, Frequency (Times/Week): 7, Weekly Exercise (Minutes/Week): 210, Intensity: Mild, Exercise limited by: None identified  Goals Addressed            This Visit's Progress   . Exercise 3x per week (30 min per time)       Weight bearing exercise for osteoporosis      Depression Screen PHQ 2/9 Scores 12/06/2020 11/14/2020 08/14/2020 10/24/2016 04/23/2016 10/13/2015 09/25/2015  PHQ - 2 Score 0 0 0 0 0 0 0    Fall Risk Fall Risk  12/06/2020 11/14/2020 08/14/2020 10/24/2016 04/23/2016  Falls in the past year? 0 0 0 No No  Number  falls in past yr: 0 - - - -  Injury with Fall? 0 - - - -  Risk for fall due to : No Fall Risks - - - -  Follow up Falls prevention discussed - - - -    FALL RISK PREVENTION PERTAINING TO THE HOME:  Any stairs in or around the home? No  If so, are there any without handrails? No  Home free of loose throw rugs in walkways, pet beds, electrical cords, etc? Yes  Adequate lighting in your home to reduce risk of falls? Yes   ASSISTIVE DEVICES UTILIZED TO PREVENT FALLS:  Life alert? No  Use of a cane, walker or w/c? No  Grab bars in the bathroom? Yes  Shower chair or bench in shower? Yes  Elevated toilet seat or a handicapped toilet? No   TIMED UP AND GO:  Was the test performed? No .  Telephonic visit.  Cognitive Function:     6CIT Screen 12/06/2020  What Year? 0 points  What month? 0 points  What time? 0 points  Count back from 20 0 points  Months in reverse 0 points  Repeat phrase 4 points  Total Score 4    Immunizations Immunization History  Administered Date(s) Administered  . Td 02/15/2014  . Tdap 02/15/2014    TDAP status: Up to date  Flu Vaccine status: Declined, Education has been provided regarding the importance of this  vaccine but patient still declined. Advised may receive this vaccine at local pharmacy or Health Dept. Aware to provide a copy of the vaccination record if obtained from local pharmacy or Health Dept. Verbalized acceptance and understanding.  Pneumococcal vaccine status: Declined,  Education has been provided regarding the importance of this vaccine but patient still declined. Advised may receive this vaccine at local pharmacy or Health Dept. Aware to provide a copy of the vaccination record if obtained from local pharmacy or Health Dept. Verbalized acceptance and understanding.   Covid-19 vaccine status: Declined, Education has been provided regarding the importance of this vaccine but patient still declined. Advised may receive this vaccine at local pharmacy or Health Dept.or vaccine clinic. Aware to provide a copy of the vaccination record if obtained from local pharmacy or Health Dept. Verbalized acceptance and understanding.  Qualifies for Shingles Vaccine? Yes   Zostavax completed No   Shingrix Completed?: No.    Education has been provided regarding the importance of this vaccine. Patient has been advised to call insurance company to determine out of pocket expense if they have not yet received this vaccine. Advised may also receive vaccine at local pharmacy or Health Dept. Verbalized acceptance and understanding.  Screening Tests Health Maintenance  Topic Date Due  . COLON CANCER SCREENING ANNUAL FOBT  Never done  . COLONOSCOPY (Pts 45-45yrs Insurance coverage will need to be confirmed)  Never done  . Zoster Vaccines- Shingrix (1 of 2) Never done  . PNA vac Low Risk Adult (1 of 2 - PCV13) 08/14/2021 (Originally 09/14/2018)  . COVID-19 Vaccine (1) 11/30/2021 (Originally 09/14/1960)  . INFLUENZA VACCINE  02/05/2021  . MAMMOGRAM  10/04/2021  . DEXA SCAN  10/05/2022  . TETANUS/TDAP  02/16/2024  . Hepatitis C Screening  Completed  . HPV VACCINES  Aged Out    Health Maintenance  Health  Maintenance Due  Topic Date Due  . COLON CANCER SCREENING ANNUAL FOBT  Never done  . COLONOSCOPY (Pts 45-76yrs Insurance coverage will need to be confirmed)  Never done  . Zoster  Vaccines- Shingrix (1 of 2) Never done    Colorectal cancer screening: Cologuard ordered 11/2020 - patient waiting on shipment  Mammogram status: Completed 10/04/2020. Repeat every year  Bone Density status: Completed 10/04/2020. Results reflect: Bone density results: OSTEOPOROSIS. Repeat every 2 years.  Lung Cancer Screening: (Low Dose CT Chest recommended if Age 16-80 years, 30 pack-year currently smoking OR have quit w/in 15years.) does not qualify.   Additional Screening:  Hepatitis C Screening: does qualify; Completed 10/13/2015  Vision Screening: Recommended annual ophthalmology exams for early detection of glaucoma and other disorders of the eye. Is the patient up to date with their annual eye exam?  No  Who is the provider or what is the name of the office in which the patient attends annual eye exams? n/a If pt is not established with a provider, would they like to be referred to a provider to establish care? No .   Dental Screening: Recommended annual dental exams for proper oral hygiene  Community Resource Referral / Chronic Care Management: CRR required this visit?  No   CCM required this visit?  No      Plan:     I have personally reviewed and noted the following in the patient's chart:   . Medical and social history . Use of alcohol, tobacco or illicit drugs  . Current medications and supplements including opioid prescriptions. Patient is not currently taking opioid prescriptions. . Functional ability and status . Nutritional status . Physical activity . Advanced directives . List of other physicians . Hospitalizations, surgeries, and ER visits in previous 12 months . Vitals . Screenings to include cognitive, depression, and falls . Referrals and appointments  In addition, I have  reviewed and discussed with patient certain preventive protocols, quality metrics, and best practice recommendations. A written personalized care plan for preventive services as well as general preventive health recommendations were provided to patient.     [place] Constablemy E Kemuel Buchmann, LPN   4/0/98116/07/2020   Nurse Notes: None

## 2021-02-20 DIAGNOSIS — H90A21 Sensorineural hearing loss, unilateral, right ear, with restricted hearing on the contralateral side: Secondary | ICD-10-CM | POA: Diagnosis not present

## 2021-02-20 DIAGNOSIS — H90A32 Mixed conductive and sensorineural hearing loss, unilateral, left ear with restricted hearing on the contralateral side: Secondary | ICD-10-CM | POA: Diagnosis not present

## 2021-02-27 DIAGNOSIS — I10 Essential (primary) hypertension: Secondary | ICD-10-CM | POA: Diagnosis not present

## 2021-02-27 DIAGNOSIS — Z7983 Long term (current) use of bisphosphonates: Secondary | ICD-10-CM | POA: Diagnosis not present

## 2021-02-27 DIAGNOSIS — J309 Allergic rhinitis, unspecified: Secondary | ICD-10-CM | POA: Diagnosis not present

## 2021-02-27 DIAGNOSIS — Z008 Encounter for other general examination: Secondary | ICD-10-CM | POA: Diagnosis not present

## 2021-02-27 DIAGNOSIS — M810 Age-related osteoporosis without current pathological fracture: Secondary | ICD-10-CM | POA: Diagnosis not present

## 2021-05-17 ENCOUNTER — Encounter: Payer: Self-pay | Admitting: Family Medicine

## 2021-05-17 ENCOUNTER — Ambulatory Visit (INDEPENDENT_AMBULATORY_CARE_PROVIDER_SITE_OTHER): Payer: Medicare HMO | Admitting: Family Medicine

## 2021-05-17 ENCOUNTER — Other Ambulatory Visit: Payer: Self-pay

## 2021-05-17 VITALS — BP 140/73 | HR 66 | Temp 98.0°F | Ht 63.0 in | Wt 155.0 lb

## 2021-05-17 DIAGNOSIS — E559 Vitamin D deficiency, unspecified: Secondary | ICD-10-CM | POA: Diagnosis not present

## 2021-05-17 DIAGNOSIS — M810 Age-related osteoporosis without current pathological fracture: Secondary | ICD-10-CM

## 2021-05-17 DIAGNOSIS — I10 Essential (primary) hypertension: Secondary | ICD-10-CM | POA: Diagnosis not present

## 2021-05-17 DIAGNOSIS — E663 Overweight: Secondary | ICD-10-CM | POA: Diagnosis not present

## 2021-05-17 NOTE — Progress Notes (Signed)
Established Patient Office Visit  Subjective:  Patient ID: Dana Gray, female    DOB: 10/02/1955  Age: 65 y.o. MRN: [id number]  CC:  Chief Complaint  Patient presents with   Medical Management of Chronic Issues   Hypertension    HPI Dana Gray presents for chronic follow up.   HTN Complaint with meds - forgets to take her medications some days Current Medications - lisinopril 20 mg Checking BP at home ranging 130s/80s Exercising Regularly - tries to stay active Pertinent ROS:  Visual Disturbances - No Chest pain - No Dyspnea - No Palpitations - No LE edema - No  2. Osteoporosis She has been forgetting to take her fosamax for the last few months. She has been taking oscal daily.    BP Readings from Last 3 Encounters:  05/17/21 140/73  11/14/20 122/67  08/14/20 (!) 148/72   CMP Latest Ref Rng & Units 11/24/2020 11/14/2020 09/01/2020  Glucose 65 - 99 mg/dL 111(H) 73 93  BUN 8 - 27 mg/dL $Remove'15 13 11  'ywFZzkr$ Creatinine 0.57 - 1.00 mg/dL 0.82 0.80 0.84  Sodium 134 - 144 mmol/L 138 141 139  Potassium 3.5 - 5.2 mmol/L 5.2 5.7(H) 4.5  Chloride 96 - 106 mmol/L 101 103 102  CO2 20 - 29 mmol/L $RemoveB'22 24 22  'UkzXOGKl$ Calcium 8.7 - 10.3 mg/dL 9.6 9.6 9.3  Total Protein 6.0 - 8.5 g/dL - 7.1 6.7  Total Bilirubin 0.0 - 1.2 mg/dL - 0.2 0.4  Alkaline Phos 44 - 121 IU/L - 118 99  AST 0 - 40 IU/L - 23 25  ALT 0 - 32 IU/L - 21 13      Past Medical History:  Diagnosis Date   Chest pain    HTN (hypertension)     Past Surgical History:  Procedure Laterality Date   ABDOMINAL HYSTERECTOMY     SHOULDER SURGERY     TUBAL LIGATION  1982    Family History  Problem Relation Age of Onset   Hypertension Mother    Arthritis Mother    Emphysema Father    COPD Father    Heart failure Father    Breast cancer Sister     Social History   Socioeconomic History   Marital status: Married    Spouse name: Not on file   Number of children: Not on file   Years of education: Not on file   Highest  education level: Not on file  Occupational History   Not on file  Tobacco Use   Smoking status: Never   Smokeless tobacco: Never  Substance and Sexual Activity   Alcohol use: No   Drug use: No   Sexual activity: Not on file  Other Topics Concern   Not on file  Social History Narrative   Lives home with husband. Children live nearby   Social Determinants of Health   Financial Resource Strain: Low Risk    Difficulty of Paying Living Expenses: Not hard at all  Food Insecurity: No Food Insecurity   Worried About Charity fundraiser in the Last Year: Never true   Arboriculturist in the Last Year: Never true  Transportation Needs: No Transportation Needs   Lack of Transportation (Medical): No   Lack of Transportation (Non-Medical): No  Physical Activity: Sufficiently Active   Days of Exercise per Week: 7 days   Minutes of Exercise per Session: 30 min  Stress: No Stress Concern Present   Feeling of Stress : Not at  all  Social Connections: Moderately Isolated   Frequency of Communication with Friends and Family: More than three times a week   Frequency of Social Gatherings with Friends and Family: More than three times a week   Attends Religious Services: Never   Marine scientist or Organizations: No   Attends Music therapist: Never   Marital Status: Married  Human resources officer Violence: Not At Risk   Fear of Current or Ex-Partner: No   Emotionally Abused: No   Physically Abused: No   Sexually Abused: No    Outpatient Medications Prior to Visit  Medication Sig Dispense Refill   calcium-vitamin D (OSCAL 500/200 D-3) 500-200 MG-UNIT tablet Take 2 tablets by mouth daily with breakfast. 180 tablet 0   lisinopril (ZESTRIL) 20 MG tablet Take 1 tablet (20 mg total) by mouth daily. 90 tablet 1   alendronate (FOSAMAX) 70 MG tablet Take 1 tablet (70 mg total) by mouth once a week. Take with a full glass of water on an empty stomach. (Patient not taking: Reported on  05/17/2021) 12 tablet 0   Vitamin D, Ergocalciferol, (DRISDOL) 1.25 MG (50000 UNIT) CAPS capsule Take 1 capsule (50,000 Units total) by mouth every 7 (seven) days. 8 capsule 0   No facility-administered medications prior to visit.    No Known Allergies  ROS Review of Systems As per HPI.    Objective:    Physical Exam Vitals and nursing note reviewed.  Constitutional:      General: She is not in acute distress.    Appearance: Normal appearance. She is not ill-appearing, toxic-appearing or diaphoretic.  Cardiovascular:     Rate and Rhythm: Normal rate and regular rhythm.     Heart sounds: Normal heart sounds. No murmur heard. Pulmonary:     Effort: Pulmonary effort is normal. No respiratory distress.     Breath sounds: Normal breath sounds.  Musculoskeletal:     Right lower leg: No edema.     Left lower leg: No edema.  Skin:    General: Skin is warm and dry.  Neurological:     General: No focal deficit present.     Mental Status: She is alert and oriented to person, place, and time.  Psychiatric:        Mood and Affect: Mood normal.        Behavior: Behavior normal.    BP 140/73   Pulse 66   Temp 98 F (36.7 C) (Temporal)   Ht $R'5\' 3"'eS$  (1.6 m)   Wt 155 lb (70.3 kg)   BMI 27.46 kg/m  Wt Readings from Last 3 Encounters:  05/17/21 155 lb (70.3 kg)  12/06/20 151 lb (68.5 kg)  11/14/20 151 lb 8 oz (68.7 kg)     Health Maintenance Due  Topic Date Due   COLON CANCER SCREENING ANNUAL FOBT  Never done   COLONOSCOPY (Pts 45-25yrs Insurance coverage will need to be confirmed)  Never done    There are no preventive care reminders to display for this patient.  Lab Results  Component Value Date   TSH 3.070 09/01/2020   Lab Results  Component Value Date   WBC 8.4 11/14/2020   HGB 12.4 11/14/2020   HCT 38.8 11/14/2020   MCV 85 11/14/2020   PLT 351 11/14/2020   Lab Results  Component Value Date   NA 138 11/24/2020   K 5.2 11/24/2020   CO2 22 11/24/2020    GLUCOSE 111 (H) 11/24/2020   BUN 15 11/24/2020  CREATININE 0.82 11/24/2020   BILITOT 0.2 11/14/2020   ALKPHOS 118 11/14/2020   AST 23 11/14/2020   ALT 21 11/14/2020   PROT 7.1 11/14/2020   ALBUMIN 4.6 11/14/2020   CALCIUM 9.6 11/24/2020   EGFR 78 11/24/2020   GFR 93.05 03/07/2011   Lab Results  Component Value Date   CHOL 198 11/14/2020   Lab Results  Component Value Date   HDL 93 11/14/2020   Lab Results  Component Value Date   LDLCALC 93 11/14/2020   Lab Results  Component Value Date   TRIG 66 11/14/2020   Lab Results  Component Value Date   CHOLHDL 2.1 11/14/2020   No results found for: HGBA1C    Assessment & Plan:   Keryn was seen today for medical management of chronic issues and hypertension.  Diagnoses and all orders for this visit:  Primary hypertension Reports well controlled with home reading. She does often misses doses of medication- discussed compliance. Labs pending.  -     CBC with Differential/Platelet -     CMP14+EGFR -     Lipid panel  Overweight (BMI 25.0-29.9) BMI 27. Diet and exercise.  -     CBC with Differential/Platelet -     CMP14+EGFR -     Lipid panel  Age-related osteoporosis without current pathological fracture Discussed fosamax compliance. Continue oscal. Weight bearing exercise.   Vitamin D deficiency On oral supplement.    Follow-up: Return in about 6 months (around 11/14/2021) for chronic follow up.  The patient indicates understanding of these issues and agrees with the plan.     Gwenlyn Perking, FNP

## 2021-05-17 NOTE — Patient Instructions (Signed)

## 2021-05-18 LAB — CMP14+EGFR
ALT: 15 IU/L (ref 0–32)
AST: 24 IU/L (ref 0–40)
Albumin/Globulin Ratio: 2 (ref 1.2–2.2)
Albumin: 4.5 g/dL (ref 3.8–4.8)
Alkaline Phosphatase: 108 IU/L (ref 44–121)
BUN/Creatinine Ratio: 21 (ref 12–28)
BUN: 17 mg/dL (ref 8–27)
Bilirubin Total: 0.2 mg/dL (ref 0.0–1.2)
CO2: 24 mmol/L (ref 20–29)
Calcium: 9.4 mg/dL (ref 8.7–10.3)
Chloride: 103 mmol/L (ref 96–106)
Creatinine, Ser: 0.81 mg/dL (ref 0.57–1.00)
Globulin, Total: 2.3 g/dL (ref 1.5–4.5)
Glucose: 93 mg/dL (ref 70–99)
Potassium: 5 mmol/L (ref 3.5–5.2)
Sodium: 140 mmol/L (ref 134–144)
Total Protein: 6.8 g/dL (ref 6.0–8.5)
eGFR: 80 mL/min/{1.73_m2} (ref >59)

## 2021-05-18 LAB — CBC WITH DIFFERENTIAL/PLATELET
Basophils Absolute: 0.1 10*3/uL (ref 0.0–0.2)
Basos: 1 %
EOS (ABSOLUTE): 0.2 10*3/uL (ref 0.0–0.4)
Eos: 2 %
Hematocrit: 39.5 % (ref 34.0–46.6)
Hemoglobin: 12.8 g/dL (ref 11.1–15.9)
Immature Grans (Abs): 0 10*3/uL (ref 0.0–0.1)
Immature Granulocytes: 0 %
Lymphocytes Absolute: 1.8 10*3/uL (ref 0.7–3.1)
Lymphs: 19 %
MCH: 27.9 pg (ref 26.6–33.0)
MCHC: 32.4 g/dL (ref 31.5–35.7)
MCV: 86 fL (ref 79–97)
Monocytes Absolute: 0.9 10*3/uL (ref 0.1–0.9)
Monocytes: 10 %
Neutrophils Absolute: 6.3 10*3/uL (ref 1.4–7.0)
Neutrophils: 68 %
Platelets: 358 10*3/uL (ref 150–450)
RBC: 4.59 x10E6/uL (ref 3.77–5.28)
RDW: 13.1 % (ref 11.7–15.4)
WBC: 9.2 10*3/uL (ref 3.4–10.8)

## 2021-05-18 LAB — LIPID PANEL
Chol/HDL Ratio: 2.2 ratio (ref 0.0–4.4)
Cholesterol, Total: 191 mg/dL (ref 100–199)
HDL: 88 mg/dL (ref >39)
LDL Chol Calc (NIH): 93 mg/dL (ref 0–99)
Triglycerides: 54 mg/dL (ref 0–149)
VLDL Cholesterol Cal: 10 mg/dL (ref 5–40)

## 2021-10-02 ENCOUNTER — Other Ambulatory Visit: Payer: Self-pay | Admitting: Family Medicine

## 2021-10-02 DIAGNOSIS — Z1231 Encounter for screening mammogram for malignant neoplasm of breast: Secondary | ICD-10-CM

## 2021-10-10 ENCOUNTER — Ambulatory Visit
Admission: RE | Admit: 2021-10-10 | Discharge: 2021-10-10 | Disposition: A | Discharge status: Home / Self Care | Payer: Medicare HMO | Source: Ambulatory Visit | Attending: Family Medicine | Admitting: Family Medicine

## 2021-10-10 DIAGNOSIS — Z1231 Encounter for screening mammogram for malignant neoplasm of breast: Secondary | ICD-10-CM

## 2021-11-14 ENCOUNTER — Encounter: Payer: Self-pay | Admitting: Family Medicine

## 2021-11-14 ENCOUNTER — Ambulatory Visit (INDEPENDENT_AMBULATORY_CARE_PROVIDER_SITE_OTHER): Payer: Medicare HMO | Admitting: Family Medicine

## 2021-11-14 VITALS — BP 136/80 | HR 69 | Temp 98.0°F | Ht 63.0 in | Wt 161.0 lb

## 2021-11-14 DIAGNOSIS — E663 Overweight: Secondary | ICD-10-CM

## 2021-11-14 DIAGNOSIS — E559 Vitamin D deficiency, unspecified: Secondary | ICD-10-CM | POA: Diagnosis not present

## 2021-11-14 DIAGNOSIS — F4321 Adjustment disorder with depressed mood: Secondary | ICD-10-CM

## 2021-11-14 DIAGNOSIS — I10 Essential (primary) hypertension: Secondary | ICD-10-CM | POA: Diagnosis not present

## 2021-11-14 DIAGNOSIS — F320 Major depressive disorder, single episode, mild: Secondary | ICD-10-CM | POA: Diagnosis not present

## 2021-11-14 DIAGNOSIS — R69 Illness, unspecified: Secondary | ICD-10-CM | POA: Diagnosis not present

## 2021-11-14 NOTE — Patient Instructions (Signed)

## 2021-11-14 NOTE — Progress Notes (Signed)
? ?Established Patient Office Visit ? ?Subjective   ?Patient ID: Dana Gray, female    DOB: 1956/03/27  Age: 66 y.o. MRN: [id number] ? ?Chief Complaint  ?Patient presents with  ? Medical Management of Chronic Issues  ? Hypertension  ? ? ?HPI ? ?Dana Gray reports doing fair overall since her last visit. Her husband passed away in 2022/08/09 of this year and adjusting to this has been difficult for her. She reports that she has not been sleeping well overall and has not had much of an appetite. Her husband was under hospice care and she knows that she can reach out to them for support services but does not desire to do so at this time.  ? ?She has been taking her lisinopril daily. She has been checking her BP regularly. She does not typically sit and rest for 5 minutes before checking it. Home readings are with a wide range of 130-150s/70-80s. She denies chest pain, shortness of breath, edema, palpitations, orthopnea or visual disturbances.  ? ?She is fasting this morning for labs.  ? ? ?  11/14/2021  ?  8:57 AM 05/17/2021  ?  1:18 PM 12/06/2020  ?  9:28 AM  ?Depression screen PHQ 2/9  ?Decreased Interest 1 0 0  ?Down, Depressed, Hopeless 1 0 0  ?PHQ - 2 Score 2 0 0  ?Altered sleeping 1 0   ?Tired, decreased energy 1 0   ?Change in appetite 1 0   ?Feeling bad or failure about yourself  0 0   ?Trouble concentrating 0 0   ?Moving slowly or fidgety/restless 0 0   ?Suicidal thoughts 0 0   ?PHQ-9 Score 5 0   ?Difficult doing work/chores Somewhat difficult Not difficult at all   ? ? ?  11/14/2021  ?  8:57 AM 05/17/2021  ?  1:18 PM  ?GAD 7 : Generalized Anxiety Score  ?Nervous, Anxious, on Edge 1 0  ?Control/stop worrying 1 0  ?Worry too much - different things 1 1  ?Trouble relaxing 1 0  ?Restless 1 0  ?Easily annoyed or irritable 0 0  ?Afraid - awful might happen 0 1  ?Total GAD 7 Score 5 2  ?Anxiety Difficulty Somewhat difficult Not difficult at all  ? ? ? ?Past Medical History:  ?Diagnosis Date  ? Chest pain   ? HTN  (hypertension)   ? ?  ? ?ROS ?As per HPI.  ?  ?Objective:  ?  ? ?BP 136/80 Comment: at home reading per pt  Pulse 69   Temp 98 ?F (36.7 ?C) (Temporal)   Ht $R'5\' 3"'PB$  (1.6 m)   Wt 161 lb (73 kg)   BMI 28.52 kg/m?  ?BP Readings from Last 3 Encounters:  ?11/14/21 136/80  ?05/17/21 140/73  ?11/14/20 122/67  ? ?  ? ?Physical Exam ?Vitals and nursing note reviewed.  ?Constitutional:   ?   General: She is not in acute distress. ?   Appearance: She is not ill-appearing, toxic-appearing or diaphoretic.  ?Neck:  ?   Thyroid: No thyroid mass, thyromegaly or thyroid tenderness.  ?   Vascular: No carotid bruit or JVD.  ?Cardiovascular:  ?   Rate and Rhythm: Normal rate and regular rhythm.  ?   Heart sounds: Normal heart sounds. No murmur heard. ?Pulmonary:  ?   Effort: Pulmonary effort is normal. No respiratory distress.  ?Abdominal:  ?   General: Bowel sounds are normal. There is no distension.  ?   Palpations: Abdomen is soft.  ?  Tenderness: There is no abdominal tenderness. There is no guarding or rebound.  ?Musculoskeletal:  ?   Right lower leg: No edema.  ?   Left lower leg: No edema.  ?Skin: ?   General: Skin is warm and dry.  ?Neurological:  ?   General: No focal deficit present.  ?   Mental Status: She is alert and oriented to person, place, and time.  ?Psychiatric:     ?   Mood and Affect: Affect is tearful.     ?   Speech: Speech normal.     ?   Behavior: Behavior normal.     ?   Thought Content: Thought content normal.  ? ? ? ?No results found for any visits on 11/14/21. ? ?Last CBC ?Lab Results  ?Component Value Date  ? WBC 9.2 05/17/2021  ? HGB 12.8 05/17/2021  ? HCT 39.5 05/17/2021  ? MCV 86 05/17/2021  ? MCH 27.9 05/17/2021  ? RDW 13.1 05/17/2021  ? PLT 358 05/17/2021  ? ?Last metabolic panel ?Lab Results  ?Component Value Date  ? GLUCOSE 93 05/17/2021  ? NA 140 05/17/2021  ? K 5.0 05/17/2021  ? CL 103 05/17/2021  ? CO2 24 05/17/2021  ? BUN 17 05/17/2021  ? CREATININE 0.81 05/17/2021  ? EGFR 80 05/17/2021  ?  CALCIUM 9.4 05/17/2021  ? PROT 6.8 05/17/2021  ? ALBUMIN 4.5 05/17/2021  ? LABGLOB 2.3 05/17/2021  ? AGRATIO 2.0 05/17/2021  ? BILITOT 0.2 05/17/2021  ? ALKPHOS 108 05/17/2021  ? AST 24 05/17/2021  ? ALT 15 05/17/2021  ? ?Last lipids ?Lab Results  ?Component Value Date  ? CHOL 191 05/17/2021  ? HDL 88 05/17/2021  ? [place] 93 05/17/2021  ? TRIG 54 05/17/2021  ? CHOLHDL 2.2 05/17/2021  ? ?Last hemoglobin A1c ?No results found for: HGBA1C ?Last thyroid functions ?Lab Results  ?Component Value Date  ? TSH 3.070 09/01/2020  ? T4TOTAL 7.2 02/15/2014  ? ?Last vitamin D ?Lab Results  ?Component Value Date  ? VD25OH 22.8 (L) 11/14/2020  ? ?  ? ?The 10-year ASCVD risk score (Arnett DK, et al., 2019) is: 9.9% ? ?  ?Assessment & Plan:  ? ?Dana Gray was seen today for medical management of chronic issues and hypertension. ? ?Diagnoses and all orders for this visit: ? ?Primary hypertension ?Elevated today in office and often at home. Discussed proper way to check BP at home. Log given. Will follow up in 2 weeks to reassess. Labs pending.  ?-     CBC with Differential/Platelet ?-     CMP14+EGFR ?-     Thyroid Panel With TSH ?-     Lipid panel ? ?Overweight (BMI 25.0-29.9) ?Labs pending. Diet and exercise.  ?-     CBC with Differential/Platelet ?-     CMP14+EGFR ?-     Thyroid Panel With TSH ?-     Lipid panel ? ?Vitamin D deficiency ?On repletion therapy. Labs pending.  ?-     VITAMIN D 25 Hydroxy (Vit-D Deficiency, Fractures) ? ?Depression, major, single episode, mild ([place]) ?Grieving ?Recent loss of husband. Declined treatment today. She is aware of resources available. Denies SI.  ? ? ?Return in about 2 weeks (around 11/28/2021) for HTN. ? ?The patient indicates understanding of these issues and agrees with the plan. ?  ? ? ?Gwenlyn Perking, FNP ? ?

## 2021-11-15 ENCOUNTER — Other Ambulatory Visit: Payer: Self-pay | Admitting: Family Medicine

## 2021-11-15 ENCOUNTER — Telehealth: Payer: Self-pay

## 2021-11-15 DIAGNOSIS — E559 Vitamin D deficiency, unspecified: Secondary | ICD-10-CM

## 2021-11-15 LAB — CBC WITH DIFFERENTIAL/PLATELET
Basophils Absolute: 0.1 10*3/uL (ref 0.0–0.2)
Basos: 1 %
EOS (ABSOLUTE): 0.2 10*3/uL (ref 0.0–0.4)
Eos: 3 %
Hematocrit: 37.6 % (ref 34.0–46.6)
Hemoglobin: 12.6 g/dL (ref 11.1–15.9)
Immature Grans (Abs): 0 10*3/uL (ref 0.0–0.1)
Immature Granulocytes: 0 %
Lymphocytes Absolute: 1.4 10*3/uL (ref 0.7–3.1)
Lymphs: 23 %
MCH: 28.4 pg (ref 26.6–33.0)
MCHC: 33.5 g/dL (ref 31.5–35.7)
MCV: 85 fL (ref 79–97)
Monocytes Absolute: 0.6 10*3/uL (ref 0.1–0.9)
Monocytes: 11 %
Neutrophils Absolute: 3.7 10*3/uL (ref 1.4–7.0)
Neutrophils: 62 %
Platelets: 350 10*3/uL (ref 150–450)
RBC: 4.44 x10E6/uL (ref 3.77–5.28)
RDW: 12.5 % (ref 11.7–15.4)
WBC: 5.9 10*3/uL (ref 3.4–10.8)

## 2021-11-15 LAB — CMP14+EGFR
ALT: 14 IU/L (ref 0–32)
AST: 23 IU/L (ref 0–40)
Albumin/Globulin Ratio: 2 (ref 1.2–2.2)
Albumin: 4.6 g/dL (ref 3.8–4.8)
Alkaline Phosphatase: 119 IU/L (ref 44–121)
BUN/Creatinine Ratio: 24 (ref 12–28)
BUN: 20 mg/dL (ref 8–27)
Bilirubin Total: <0.2 mg/dL (ref 0.0–1.2)
CO2: 22 mmol/L (ref 20–29)
Calcium: 9.5 mg/dL (ref 8.7–10.3)
Chloride: 104 mmol/L (ref 96–106)
Creatinine, Ser: 0.82 mg/dL (ref 0.57–1.00)
Globulin, Total: 2.3 g/dL (ref 1.5–4.5)
Glucose: 90 mg/dL (ref 70–99)
Potassium: 6 mmol/L (ref 3.5–5.2)
Sodium: 139 mmol/L (ref 134–144)
Total Protein: 6.9 g/dL (ref 6.0–8.5)
eGFR: 78 mL/min/{1.73_m2} (ref >59)

## 2021-11-15 LAB — THYROID PANEL WITH TSH
Free Thyroxine Index: 2.1 (ref 1.2–4.9)
T3 Uptake Ratio: 26 % (ref 24–39)
T4, Total: 7.9 ug/dL (ref 4.5–12.0)
TSH: 1.69 u[IU]/mL (ref 0.450–4.500)

## 2021-11-15 LAB — LIPID PANEL
Chol/HDL Ratio: 2.3 ratio (ref 0.0–4.4)
Cholesterol, Total: 185 mg/dL (ref 100–199)
HDL: 82 mg/dL (ref >39)
LDL Chol Calc (NIH): 92 mg/dL (ref 0–99)
Triglycerides: 60 mg/dL (ref 0–149)
VLDL Cholesterol Cal: 11 mg/dL (ref 5–40)

## 2021-11-15 LAB — VITAMIN D 25 HYDROXY (VIT D DEFICIENCY, FRACTURES): Vit D, 25-Hydroxy: 22.5 ng/mL — ABNORMAL LOW (ref 30.0–100.0)

## 2021-11-15 MED ORDER — VITAMIN D (ERGOCALCIFEROL) 1.25 MG (50000 UNIT) PO CAPS: 50000.0000 [IU] | ORAL_CAPSULE | ORAL | 0 refills | Status: DC

## 2021-11-15 NOTE — Telephone Encounter (Signed)
Critical lab potassium 6.0 ?

## 2021-11-16 ENCOUNTER — Telehealth: Payer: Self-pay | Admitting: Family Medicine

## 2021-11-16 ENCOUNTER — Encounter: Payer: Self-pay | Admitting: Emergency Medicine

## 2021-11-16 DIAGNOSIS — E875 Hyperkalemia: Secondary | ICD-10-CM

## 2021-11-16 DIAGNOSIS — E559 Vitamin D deficiency, unspecified: Secondary | ICD-10-CM

## 2021-11-16 MED ORDER — VITAMIN D (ERGOCALCIFEROL) 1.25 MG (50000 UNIT) PO CAPS: 50000.0000 [IU] | ORAL_CAPSULE | ORAL | 0 refills | Status: DC

## 2021-11-16 NOTE — Telephone Encounter (Signed)
Called about labs - she is having labs repeated Monday  ? ?Orders placed  ?

## 2021-11-19 ENCOUNTER — Other Ambulatory Visit: Payer: Medicare HMO

## 2021-11-19 DIAGNOSIS — E875 Hyperkalemia: Secondary | ICD-10-CM | POA: Diagnosis not present

## 2021-11-20 LAB — BMP8+EGFR
BUN/Creatinine Ratio: 17 (ref 12–28)
BUN: 15 mg/dL (ref 8–27)
CO2: 24 mmol/L (ref 20–29)
Calcium: 9.3 mg/dL (ref 8.7–10.3)
Chloride: 105 mmol/L (ref 96–106)
Creatinine, Ser: 0.86 mg/dL (ref 0.57–1.00)
Glucose: 96 mg/dL (ref 70–99)
Potassium: 5.2 mmol/L (ref 3.5–5.2)
Sodium: 141 mmol/L (ref 134–144)
eGFR: 74 mL/min/{1.73_m2} (ref >59)

## 2021-11-28 ENCOUNTER — Encounter: Payer: Self-pay | Admitting: Family Medicine

## 2021-11-28 ENCOUNTER — Ambulatory Visit (INDEPENDENT_AMBULATORY_CARE_PROVIDER_SITE_OTHER): Payer: Medicare HMO | Admitting: Family Medicine

## 2021-11-28 VITALS — BP 131/66 | HR 72 | Temp 98.2°F | Ht 63.0 in | Wt 160.2 lb

## 2021-11-28 DIAGNOSIS — I10 Essential (primary) hypertension: Secondary | ICD-10-CM

## 2021-11-28 DIAGNOSIS — Z1211 Encounter for screening for malignant neoplasm of colon: Secondary | ICD-10-CM | POA: Diagnosis not present

## 2021-11-28 DIAGNOSIS — R69 Illness, unspecified: Secondary | ICD-10-CM | POA: Diagnosis not present

## 2021-11-28 DIAGNOSIS — F320 Major depressive disorder, single episode, mild: Secondary | ICD-10-CM | POA: Diagnosis not present

## 2021-11-28 MED ORDER — LISINOPRIL-HYDROCHLOROTHIAZIDE 20-12.5 MG PO TABS: 1.0000 | ORAL_TABLET | Freq: Every day | ORAL | 3 refills | Status: DC

## 2021-11-28 NOTE — Patient Instructions (Signed)
Hypertension, Adult High blood pressure (hypertension) is when the force of blood pumping through the arteries is too strong. The arteries are the blood vessels that carry blood from the heart throughout the body. Hypertension forces the heart to work harder to pump blood and may cause arteries to become narrow or stiff. Untreated or uncontrolled hypertension can lead to a heart attack, heart failure, a stroke, kidney disease, and other problems. A blood pressure reading consists of a higher number over a lower number. Ideally, your blood pressure should be below 120/80. The first ("top") number is called the systolic pressure. It is a measure of the pressure in your arteries as your heart beats. The second ("bottom") number is called the diastolic pressure. It is a measure of the pressure in your arteries as the heart relaxes. What are the causes? The exact cause of this condition is not known. There are some conditions that result in high blood pressure. What increases the risk? Certain factors may make you more likely to develop high blood pressure. Some of these risk factors are under your control, including: Smoking. Not getting enough exercise or physical activity. Being overweight. Having too much fat, sugar, calories, or salt (sodium) in your diet. Drinking too much alcohol. Other risk factors include: Having a personal history of heart disease, diabetes, high cholesterol, or kidney disease. Stress. Having a family history of high blood pressure and high cholesterol. Having obstructive sleep apnea. Age. The risk increases with age. What are the signs or symptoms? High blood pressure may not cause symptoms. Very high blood pressure (hypertensive crisis) may cause: Headache. Fast or irregular heartbeats (palpitations). Shortness of breath. Nosebleed. Nausea and vomiting. Vision changes. Severe chest pain, dizziness, and seizures. How is this diagnosed? This condition is diagnosed by  measuring your blood pressure while you are seated, with your arm resting on a flat surface, your legs uncrossed, and your feet flat on the floor. The cuff of the blood pressure monitor will be placed directly against the skin of your upper arm at the level of your heart. Blood pressure should be measured at least twice using the same arm. Certain conditions can cause a difference in blood pressure between your right and left arms. If you have a high blood pressure reading during one visit or you have normal blood pressure with other risk factors, you may be asked to: Return on a different day to have your blood pressure checked again. Monitor your blood pressure at home for 1 week or longer. If you are diagnosed with hypertension, you may have other blood or imaging tests to help your health care provider understand your overall risk for other conditions. How is this treated? This condition is treated by making healthy lifestyle changes, such as eating healthy foods, exercising more, and reducing your alcohol intake. You may be referred for counseling on a healthy diet and physical activity. Your health care provider may prescribe medicine if lifestyle changes are not enough to get your blood pressure under control and if: Your systolic blood pressure is above 130. Your diastolic blood pressure is above 80. Your personal target blood pressure may vary depending on your medical conditions, your age, and other factors. Follow these instructions at home: Eating and drinking  Eat a diet that is high in fiber and potassium, and low in sodium, added sugar, and fat. An example of this eating plan is called the DASH diet. DASH stands for Dietary Approaches to Stop Hypertension. To eat this way: Eat   plenty of fresh fruits and vegetables. Try to fill one half of your plate at each meal with fruits and vegetables. Eat whole grains, such as whole-wheat pasta, brown rice, or whole-grain bread. Fill about one  fourth of your plate with whole grains. Eat or drink low-fat dairy products, such as skim milk or low-fat yogurt. Avoid fatty cuts of meat, processed or cured meats, and poultry with skin. Fill about one fourth of your plate with lean proteins, such as fish, chicken without skin, beans, eggs, or tofu. Avoid pre-made and processed foods. These tend to be higher in sodium, added sugar, and fat. Reduce your daily sodium intake. Many people with hypertension should eat less than 1,500 mg of sodium a day. Do not drink alcohol if: Your health care provider tells you not to drink. You are pregnant, may be pregnant, or are planning to become pregnant. If you drink alcohol: Limit how much you have to: 0-1 drink a day for women. 0-2 drinks a day for men. Know how much alcohol is in your drink. In the [place] one drink equals one 12 oz bottle of beer (355 mL), one 5 oz glass of wine (148 mL), or one 1 oz glass of hard liquor (44 mL). Lifestyle  Work with your health care provider to maintain a healthy body weight or to lose weight. Ask what an ideal weight is for you. Get at least 30 minutes of exercise that causes your heart to beat faster (aerobic exercise) most days of the week. Activities may include walking, swimming, or biking. Include exercise to strengthen your muscles (resistance exercise), such as Pilates or lifting weights, as part of your weekly exercise routine. Try to do these types of exercises for 30 minutes at least 3 days a week. Do not use any products that contain nicotine or tobacco. These products include cigarettes, chewing tobacco, and vaping devices, such as e-cigarettes. If you need help quitting, ask your health care provider. Monitor your blood pressure at home as told by your health care provider. Keep all follow-up visits. This is important. Medicines Take over-the-counter and prescription medicines only as told by your health care provider. Follow directions carefully. Blood  pressure medicines must be taken as prescribed. Do not skip doses of blood pressure medicine. Doing this puts you at risk for problems and can make the medicine less effective. Ask your health care provider about side effects or reactions to medicines that you should watch for. Contact a health care provider if you: Think you are having a reaction to a medicine you are taking. Have headaches that keep coming back (recurring). Feel dizzy. Have swelling in your ankles. Have trouble with your vision. Get help right away if you: Develop a severe headache or confusion. Have unusual weakness or numbness. Feel faint. Have severe pain in your chest or abdomen. Vomit repeatedly. Have trouble breathing. These symptoms may be an emergency. Get help right away. Call 911. Do not wait to see if the symptoms will go away. Do not drive yourself to the hospital. Summary Hypertension is when the force of blood pumping through your arteries is too strong. If this condition is not controlled, it may put you at risk for serious complications. Your personal target blood pressure may vary depending on your medical conditions, your age, and other factors. For most people, a normal blood pressure is less than 120/80. Hypertension is treated with lifestyle changes, medicines, or a combination of both. Lifestyle changes include losing weight, eating a healthy,   low-sodium diet, exercising more, and limiting alcohol. This information is not intended to replace advice given to you by your health care provider. Make sure you discuss any questions you have with your health care provider. Document Revised: 05/01/2021 Document Reviewed: 05/01/2021 Elsevier Patient Education  2023 [employer]  

## 2021-11-28 NOTE — Progress Notes (Signed)
Established Patient Office Visit  Subjective   Patient ID: Dana Gray, female    DOB: Sep 10, 1955  Age: 66 y.o. MRN: [id number]  Chief Complaint  Patient presents with   Hypertension    Hypertension  Timberly is here for a BP follow up. She reports doing well overall. She has been checking her blood pressure daily with readings of 130s-140s/70-80s. She does reprots some intermittent swelling in her hands and feet. She has been taking lisinopril 20 mg daily as prescribed.   She is interested in cologuard for screening. Denies family history or symptoms.      11/28/2021    9:02 AM 11/14/2021    8:57 AM 05/17/2021    1:18 PM  Depression screen PHQ 2/9  Decreased Interest 1 1 0  Down, Depressed, Hopeless 1 1 0  PHQ - 2 Score 2 2 0  Altered sleeping 1 1 0  Tired, decreased energy 1 1 0  Change in appetite 2 1 0  Feeling bad or failure about yourself  1 0 0  Trouble concentrating 1 0 0  Moving slowly or fidgety/restless 0 0 0  Suicidal thoughts 0 0 0  PHQ-9 Score 8 5 0  Difficult doing work/chores Not difficult at all Somewhat difficult Not difficult at all      11/28/2021    9:03 AM 11/14/2021    8:57 AM 05/17/2021    1:18 PM  GAD 7 : Generalized Anxiety Score  Nervous, Anxious, on Edge 1 1 0  Control/stop worrying 1 1 0  Worry too much - different things $RemoveBeforeDE'1 1 1  'cfwzJwyFIMuHylH$ Trouble relaxing 1 1 0  Restless 0 1 0  Easily annoyed or irritable 0 0 0  Afraid - awful might happen 0 0 1  Total GAD 7 Score $Remov'4 5 2  'xNRsZQ$ Anxiety Difficulty Not difficult at all Somewhat difficult Not difficult at all     Patient Active Problem List   Diagnosis Date Noted   Vitamin D deficiency 05/17/2021   Overweight (BMI 25.0-29.9) 05/17/2021   Nasal septal deviation 11/08/2020   SNHL (sensory-neural hearing loss), asymmetrical 10/09/2020   BMI 29.0-29.9,adult 04/23/2016   Hyperlipidemia 11/20/2012   Insomnia 11/20/2012   Age-related osteoporosis without current pathological fracture 11/20/2012    Primary hypertension 03/07/2011      ROS Negative unless specially indicated above in HPI.   Objective:     BP 131/66   Pulse 72   Temp 98.2 F (36.8 C) (Temporal)   Ht $R'5\' 3"'iA$  (1.6 m)   Wt 160 lb 4 oz (72.7 kg)   SpO2 99%   BMI 28.39 kg/m  BP Readings from Last 3 Encounters:  11/28/21 131/66  11/14/21 136/80  05/17/21 140/73      Physical Exam Vitals and nursing note reviewed.  Constitutional:      General: She is not in acute distress.    Appearance: She is not ill-appearing, toxic-appearing or diaphoretic.  HENT:     Nose: Nose normal.  Cardiovascular:     Rate and Rhythm: Normal rate and regular rhythm.     Heart sounds: No murmur heard. Pulmonary:     Effort: Pulmonary effort is normal. No respiratory distress.     Breath sounds: Normal breath sounds.  Musculoskeletal:     Right lower leg: No edema.     Left lower leg: No edema.  Skin:    General: Skin is warm and dry.  Neurological:     General: No focal deficit present.  Mental Status: She is alert and oriented to person, place, and time.  Psychiatric:        Mood and Affect: Mood normal.        Behavior: Behavior normal.        Thought Content: Thought content normal.        Judgment: Judgment normal.     No results found for any visits on 11/28/21.  Last CBC Lab Results  Component Value Date   WBC 5.9 11/14/2021   HGB 12.6 11/14/2021   HCT 37.6 11/14/2021   MCV 85 11/14/2021   MCH 28.4 11/14/2021   RDW 12.5 11/14/2021   PLT 350 74/25/9563   Last metabolic panel Lab Results  Component Value Date   GLUCOSE 96 11/19/2021   NA 141 11/19/2021   K 5.2 11/19/2021   CL 105 11/19/2021   CO2 24 11/19/2021   BUN 15 11/19/2021   CREATININE 0.86 11/19/2021   EGFR 74 11/19/2021   CALCIUM 9.3 11/19/2021   PROT 6.9 11/14/2021   ALBUMIN 4.6 11/14/2021   LABGLOB 2.3 11/14/2021   AGRATIO 2.0 11/14/2021   BILITOT <0.2 11/14/2021   ALKPHOS 119 11/14/2021   AST 23 11/14/2021   ALT 14  11/14/2021   Last lipids Lab Results  Component Value Date   CHOL 185 11/14/2021   HDL 82 11/14/2021   LDLCALC 92 11/14/2021   TRIG 60 11/14/2021   CHOLHDL 2.3 11/14/2021      The 10-year ASCVD risk score (Arnett DK, et al., 2019) is: 9.3%    Assessment & Plan:   Alira was seen today for hypertension.  Diagnoses and all orders for this visit:  Primary hypertension BP is at goal today in office, but is a little above goal about 50% of the time at home. Will add HCTZ to lisinopril. Monitor BP at home and notify for elevated readings.  -     lisinopril-hydrochlorothiazide (ZESTORETIC) 20-12.5 MG tablet; Take 1 tablet by mouth daily.  Depression, major, single episode, mild (HCC) Stable, reports not difficult. Denies SI.   Colon cancer screening -     Cologuard   Return in about 3 months (around 02/14/2022) for chronic follow up.    Gwenlyn Perking, FNP

## 2021-12-07 ENCOUNTER — Ambulatory Visit (INDEPENDENT_AMBULATORY_CARE_PROVIDER_SITE_OTHER): Payer: Medicare HMO

## 2021-12-07 VITALS — Wt 160.0 lb

## 2021-12-07 DIAGNOSIS — Z0000 Encounter for general adult medical examination without abnormal findings: Secondary | ICD-10-CM | POA: Diagnosis not present

## 2021-12-07 NOTE — Progress Notes (Signed)
Subjective:   Dana Gray is a 66 y.o. female who presents for Medicare Annual (Subsequent) preventive examination.  Virtual Visit via Telephone Note  I connected with  Dana Rhymesebbie A Hirt on 12/07/21 at  9:00 AM EDT by telephone and verified that I am speaking with the correct person using two identifiers.  Location: Patient: Home Provider: [facility] Persons participating in the virtual visit: patient/Nurse Health Advisor   I discussed the limitations, risks, security and privacy concerns of performing an evaluation and management service by telephone and the availability of in person appointments. The patient expressed understanding and agreed to proceed.  Interactive audio and video telecommunications were attempted between this nurse and patient, however failed, due to patient having technical difficulties OR patient did not have access to video capability.  We continued and completed visit with audio only.  Some vital signs may be absent or patient reported.   Daliya Parchment E Michal Callicott, LPN   Review of Systems     Cardiac Risk Factors include: advanced age (>555men, 41>65 women);dyslipidemia;hypertension     Objective:    Today's Vitals   12/07/21 0859  Weight: 160 lb (72.6 kg)   Body mass index is 28.34 kg/m.     12/07/2021    9:10 AM 12/06/2020    9:29 AM  Advanced Directives  Does Patient Have a Medical Advance Directive? No No  Would patient like information on creating a medical advance directive? No - Patient declined Yes (MAU/Ambulatory/Procedural Areas - Information given)    Current Medications (verified) Outpatient Encounter Medications as of 12/07/2021  Medication Sig   calcium-vitamin D (OSCAL 500/200 D-3) 500-200 MG-UNIT tablet Take 2 tablets by mouth daily with breakfast.   cetirizine (ZYRTEC) 10 MG tablet Take 10 mg by mouth daily.   lisinopril-hydrochlorothiazide (ZESTORETIC) 20-12.5 MG tablet Take 1 tablet by mouth daily.   Vitamin D, Ergocalciferol, (DRISDOL) 1.25 MG  (50000 UNIT) CAPS capsule Take 1 capsule (50,000 Units total) by mouth every 7 (seven) days.   No facility-administered encounter medications on file as of 12/07/2021.    Allergies (verified) Patient has no known allergies.   History: Past Medical History:  Diagnosis Date   Chest pain    HTN (hypertension)    Past Surgical History:  Procedure Laterality Date   ABDOMINAL HYSTERECTOMY     SHOULDER SURGERY     TUBAL LIGATION  1982   Family History  Problem Relation Age of Onset   Hypertension Mother    Arthritis Mother    Emphysema Father    COPD Father    Heart failure Father    Breast cancer Sister    Social History   Socioeconomic History   Marital status: Widowed    Spouse name: Not on file   Number of children: 2   Years of education: Not on file   Highest education level: Not on file  Occupational History   Occupation: retired  Tobacco Use   Smoking status: Never   Smokeless tobacco: Never  Substance and Sexual Activity   Alcohol use: No   Drug use: No   Sexual activity: Not on file  Other Topics Concern   Not on file  Social History Narrative   Husband passed away January 2023. Children live nearby - son and daughter   Social Determinants of Health   Financial Resource Strain: Low Risk    Difficulty of Paying Living Expenses: Not hard at all  Food Insecurity: No Food Insecurity   Worried About Running Out of  Food in the Last Year: Never true   Ran Out of Food in the Last Year: Never true  Transportation Needs: No Transportation Needs   Lack of Transportation (Medical): No   Lack of Transportation (Non-Medical): No  Physical Activity: Sufficiently Active   Days of Exercise per Week: 7 days   Minutes of Exercise per Session: 30 min  Stress: No Stress Concern Present   Feeling of Stress : Only a little  Social Connections: Moderately Isolated   Frequency of Communication with Friends and Family: More than three times a week   Frequency of Social  Gatherings with Friends and Family: More than three times a week   Attends Religious Services: Never   Database administrator or Organizations: Yes   Attends Engineer, structural: More than 4 times per year   Marital Status: Widowed    Tobacco Counseling Counseling given: Not Answered   Clinical Intake:  Pre-visit preparation completed: Yes  Pain : No/denies pain     BMI - recorded: 28.34 Nutritional Status: BMI 25 -29 Overweight Nutritional Risks: None Diabetes: No  How often do you need to have someone help you when you read instructions, pamphlets, or other written materials from your doctor or pharmacy?: 1 - Never  Diabetic? no  Interpreter Needed?: No  Information entered by :: Bryony Kaman, LPN   Activities of Daily Living    12/07/2021    9:06 AM  In your present state of health, do you have any difficulty performing the following activities:  Hearing? 0  Vision? 0  Difficulty concentrating or making decisions? 0  Walking or climbing stairs? 0  Dressing or bathing? 0  Doing errands, shopping? 0  Preparing Food and eating ? N  Using the Toilet? N  In the past six months, have you accidently leaked urine? N  Do you have problems with loss of bowel control? N  Managing your Medications? N  Managing your Finances? N  Housekeeping or managing your Housekeeping? N    Patient Care Team: Gabriel Earing, FNP as PCP - General (Family Medicine)  Indicate any recent Medical Services you may have received from other than [facility] providers in the past year (date may be approximate).     Assessment:   This is a routine wellness examination for Eupha.  Hearing/Vision screen Hearing Screening - Comments:: Moderate hearing difficulties - checking into getting hearing aids soon Vision Screening - Comments:: Wears otc reading glasses prn only - behind with routine eye exams with [facility]  Dietary issues and exercise activities discussed: Current  Exercise Habits: Home exercise routine, Type of exercise: walking;stretching, Time (Minutes): 30, Frequency (Times/Week): 7, Weekly Exercise (Minutes/Week): 210, Intensity: Mild, Exercise limited by: None identified   Goals Addressed             This Visit's Progress    Exercise 3x per week (30 min per time)   On track    Weight bearing exercise for osteoporosis       Depression Screen    12/07/2021    9:03 AM 11/28/2021    9:02 AM 11/14/2021    8:57 AM 05/17/2021    1:18 PM 12/06/2020    9:28 AM 11/14/2020   10:04 AM 08/14/2020    3:02 PM  PHQ 2/9 Scores  PHQ - 2 Score 2 2 2  0 0 0 0  PHQ- 9 Score 7 8 5  0       Fall Risk    12/07/2021  9:00 AM 11/28/2021    9:02 AM 11/14/2021    8:55 AM 05/17/2021    1:15 PM 12/06/2020    9:39 AM  Fall Risk   Falls in the past year? 0 0 0 0 0  Number falls in past yr: 0    0  Injury with Fall? 0    0  Risk for fall due to : No Fall Risks    No Fall Risks  Follow up Falls prevention discussed    Falls prevention discussed    FALL RISK PREVENTION PERTAINING TO THE HOME:  Any stairs in or around the home? No  If so, are there any without handrails? No  Home free of loose throw rugs in walkways, pet beds, electrical cords, etc? Yes  Adequate lighting in your home to reduce risk of falls? Yes   ASSISTIVE DEVICES UTILIZED TO PREVENT FALLS:  Life alert? No  Use of a cane, walker or w/c? No  Grab bars in the bathroom? Yes  Shower chair or bench in shower? Yes  Elevated toilet seat or a handicapped toilet? Yes   TIMED UP AND GO:  Was the test performed? No . Telephonic visit  Cognitive Function:        12/07/2021    9:05 AM 12/06/2020    9:31 AM  6CIT Screen  What Year? 0 points 0 points  What month? 0 points 0 points  What time? 0 points 0 points  Count back from 20 0 points 0 points  Months in reverse 0 points 0 points  Repeat phrase 0 points 4 points  Total Score 0 points 4 points    Immunizations Immunization History   Administered Date(s) Administered   Td 02/15/2014   Tdap 02/15/2014    TDAP status: Up to date  Flu Vaccine status: Declined, Education has been provided regarding the importance of this vaccine but patient still declined. Advised may receive this vaccine at local pharmacy or Health Dept. Aware to provide a copy of the vaccination record if obtained from local pharmacy or Health Dept. Verbalized acceptance and understanding.  Pneumococcal vaccine status: Declined,  Education has been provided regarding the importance of this vaccine but patient still declined. Advised may receive this vaccine at local pharmacy or Health Dept. Aware to provide a copy of the vaccination record if obtained from local pharmacy or Health Dept. Verbalized acceptance and understanding.   Covid-19 vaccine status: Declined, Education has been provided regarding the importance of this vaccine but patient still declined. Advised may receive this vaccine at local pharmacy or Health Dept.or vaccine clinic. Aware to provide a copy of the vaccination record if obtained from local pharmacy or Health Dept. Verbalized acceptance and understanding.  Qualifies for Shingles Vaccine? Yes   Zostavax completed No   Shingrix Completed?: No.    Education has been provided regarding the importance of this vaccine. Patient has been advised to call insurance company to determine out of pocket expense if they have not yet received this vaccine. Advised may also receive vaccine at local pharmacy or Health Dept. Verbalized acceptance and understanding.  Screening Tests Health Maintenance  Topic Date Due   COVID-19 Vaccine (1) Never done   Fecal DNA (Cologuard)  Never done   Pneumonia Vaccine 22+ Years old (1 - PCV) 05/17/2022 (Originally 09/14/2018)   Zoster Vaccines- Shingrix (1 of 2) 02/14/2026 (Originally 09/14/2003)   INFLUENZA VACCINE  02/05/2022   DEXA SCAN  10/05/2022   MAMMOGRAM  10/11/2022   TETANUS/TDAP  02/16/2024   Hepatitis C  Screening  Completed   HPV VACCINES  Aged Out    Health Maintenance  Health Maintenance Due  Topic Date Due   COVID-19 Vaccine (1) Never done   Fecal DNA (Cologuard)  Never done    Colorectal cancer screening: Never done - has cologuard to return soon  Mammogram status: Completed 10/10/2021. Repeat every year  Bone Density status: Completed 10/04/2020. Results reflect: Bone density results: OSTEOPOROSIS. Repeat every 2 years.  Lung Cancer Screening: (Low Dose CT Chest recommended if Age 38-80 years, 30 pack-year currently smoking OR have quit w/in 15years.) does not qualify.   Additional Screening:  Hepatitis C Screening: does qualify; Completed 10/13/2015  Vision Screening: Recommended annual ophthalmology exams for early detection of glaucoma and other disorders of the eye. Is the patient up to date with their annual eye exam?  No  Who is the provider or what is the name of the office in which the patient attends annual eye exams? none If pt is not established with a provider, would they like to be referred to a provider to establish care? No .   Dental Screening: Recommended annual dental exams for proper oral hygiene  Community Resource Referral / Chronic Care Management: CRR required this visit?  No   CCM required this visit?  No      Plan:     I have personally reviewed and noted the following in the patient's chart:   Medical and social history Use of alcohol, tobacco or illicit drugs  Current medications and supplements including opioid prescriptions.  Functional ability and status Nutritional status Physical activity Advanced directives List of other physicians Hospitalizations, surgeries, and ER visits in previous 12 months Vitals Screenings to include cognitive, depression, and falls Referrals and appointments  In addition, I have reviewed and discussed with patient certain preventive protocols, quality metrics, and best practice recommendations. A  written personalized care plan for preventive services as well as general preventive health recommendations were provided to patient.     [place] Constable, LPN   03/15/1190   Nurse Notes: None

## 2021-12-07 NOTE — Patient Instructions (Signed)
Dana Gray , Thank you for taking time to come for your Medicare Wellness Visit. I appreciate your ongoing commitment to your health goals. Please review the following plan we discussed and let me know if I can assist you in the future.   Screening recommendations/referrals: Colonoscopy: Return Cologuard test soon Mammogram: Done 10/10/2021 - Repeat annually Bone Density: Done 10/04/2020- Repeat every 2 years  Recommended yearly ophthalmology/optometry visit for glaucoma screening and checkup Recommended yearly dental visit for hygiene and checkup  Vaccinations: declines all Influenza vaccine: recommend every Fall Pneumococcal vaccine: recommend once per lifetime Prevnar-20 Tdap vaccine: Done 02/15/2014 -recommend repeat every 10 years Shingles vaccine: recommend Shingrix which is 2 doses 2-6 months apart and over 90% effective     Covid-19: recommend 2 doses one month apart with a booster 6 months later  Advanced directives: Advance directive discussed with you today. Even though you declined this today, please call our office should you change your mind, and we can give you the proper paperwork for you to fill out.   Conditions/risks identified: Aim for 30 minutes of exercise or brisk walking, 6-8 glasses of water, and 5 servings of fruits and vegetables each day.   Next appointment: Follow up in one year for your annual wellness visit    Preventive Care 65 Years and Older, Female Preventive care refers to lifestyle choices and visits with your health care provider that can promote health and wellness. What does preventive care include? A yearly physical exam. This is also called an annual well check. Dental exams once or twice a year. Routine eye exams. Ask your health care provider how often you should have your eyes checked. Personal lifestyle choices, including: Daily care of your teeth and gums. Regular physical activity. Eating a healthy diet. Avoiding tobacco and drug  use. Limiting alcohol use. Practicing safe sex. Taking low-dose aspirin every day. Taking vitamin and mineral supplements as recommended by your health care provider. What happens during an annual well check? The services and screenings done by your health care provider during your annual well check will depend on your age, overall health, lifestyle risk factors, and family history of disease. Counseling  Your health care provider may ask you questions about your: Alcohol use. Tobacco use. Drug use. Emotional well-being. Home and relationship well-being. Sexual activity. Eating habits. History of falls. Memory and ability to understand (cognition). Work and work Astronomer. Reproductive health. Screening  You may have the following tests or measurements: Height, weight, and BMI. Blood pressure. Lipid and cholesterol levels. These may be checked every 5 years, or more frequently if you are over 61 years old. Skin check. Lung cancer screening. You may have this screening every year starting at age 76 if you have a 30-pack-year history of smoking and currently smoke or have quit within the past 15 years. Fecal occult blood test (FOBT) of the stool. You may have this test every year starting at age 31. Flexible sigmoidoscopy or colonoscopy. You may have a sigmoidoscopy every 5 years or a colonoscopy every 10 years starting at age 14. Hepatitis C blood test. Hepatitis B blood test. Sexually transmitted disease (STD) testing. Diabetes screening. This is done by checking your blood sugar (glucose) after you have not eaten for a while (fasting). You may have this done every 1-3 years. Bone density scan. This is done to screen for osteoporosis. You may have this done starting at age 61. Mammogram. This may be done every 1-2 years. Talk to your health care  provider about how often you should have regular mammograms. Talk with your health care provider about your test results, treatment  options, and if necessary, the need for more tests. Vaccines  Your health care provider may recommend certain vaccines, such as: Influenza vaccine. This is recommended every year. Tetanus, diphtheria, and acellular pertussis (Tdap, Td) vaccine. You may need a Td booster every 10 years. Zoster vaccine. You may need this after age 29. Pneumococcal 13-valent conjugate (PCV13) vaccine. One dose is recommended after age 7. Pneumococcal polysaccharide (PPSV23) vaccine. One dose is recommended after age 43. Talk to your health care provider about which screenings and vaccines you need and how often you need them. This information is not intended to replace advice given to you by your health care provider. Make sure you discuss any questions you have with your health care provider. Document Released: 07/21/2015 Document Revised: 03/13/2016 Document Reviewed: 04/25/2015 Elsevier Interactive Patient Education  2017 [employer] Prevention in the Home Falls can cause injuries. They can happen to people of all ages. There are many things you can do to make your home safe and to help prevent falls. What can I do on the outside of my home? Regularly fix the edges of walkways and driveways and fix any cracks. Remove anything that might make you trip as you walk through a door, such as a raised step or threshold. Trim any bushes or trees on the path to your home. Use bright outdoor lighting. Clear any walking paths of anything that might make someone trip, such as rocks or tools. Regularly check to see if handrails are loose or broken. Make sure that both sides of any steps have handrails. Any raised decks and porches should have guardrails on the edges. Have any leaves, snow, or ice cleared regularly. Use sand or salt on walking paths during winter. Clean up any spills in your garage right away. This includes oil or grease spills. What can I do in the bathroom? Use night lights. Install grab  bars by the toilet and in the tub and shower. Do not use towel bars as grab bars. Use non-skid mats or decals in the tub or shower. If you need to sit down in the shower, use a plastic, non-slip stool. Keep the floor dry. Clean up any water that spills on the floor as soon as it happens. Remove soap buildup in the tub or shower regularly. Attach bath mats securely with double-sided non-slip rug tape. Do not have throw rugs and other things on the floor that can make you trip. What can I do in the bedroom? Use night lights. Make sure that you have a light by your bed that is easy to reach. Do not use any sheets or blankets that are too big for your bed. They should not hang down onto the floor. Have a firm chair that has side arms. You can use this for support while you get dressed. Do not have throw rugs and other things on the floor that can make you trip. What can I do in the kitchen? Clean up any spills right away. Avoid walking on wet floors. Keep items that you use a lot in easy-to-reach places. If you need to reach something above you, use a strong step stool that has a grab bar. Keep electrical cords out of the way. Do not use floor polish or wax that makes floors slippery. If you must use wax, use non-skid floor wax. Do not have throw rugs  and other things on the floor that can make you trip. What can I do with my stairs? Do not leave any items on the stairs. Make sure that there are handrails on both sides of the stairs and use them. Fix handrails that are broken or loose. Make sure that handrails are as long as the stairways. Check any carpeting to make sure that it is firmly attached to the stairs. Fix any carpet that is loose or worn. Avoid having throw rugs at the top or bottom of the stairs. If you do have throw rugs, attach them to the floor with carpet tape. Make sure that you have a light switch at the top of the stairs and the bottom of the stairs. If you do not have them,  ask someone to add them for you. What else can I do to help prevent falls? Wear shoes that: Do not have high heels. Have rubber bottoms. Are comfortable and fit you well. Are closed at the toe. Do not wear sandals. If you use a stepladder: Make sure that it is fully opened. Do not climb a closed stepladder. Make sure that both sides of the stepladder are locked into place. Ask someone to hold it for you, if possible. Clearly mark and make sure that you can see: Any grab bars or handrails. First and last steps. Where the edge of each step is. Use tools that help you move around (mobility aids) if they are needed. These include: Canes. Walkers. Scooters. Crutches. Turn on the lights when you go into a dark area. Replace any light bulbs as soon as they burn out. Set up your furniture so you have a clear path. Avoid moving your furniture around. If any of your floors are uneven, fix them. If there are any pets around you, be aware of where they are. Review your medicines with your doctor. Some medicines can make you feel dizzy. This can increase your chance of falling. Ask your doctor what other things that you can do to help prevent falls. This information is not intended to replace advice given to you by your health care provider. Make sure you discuss any questions you have with your health care provider. Document Released: 04/20/2009 Document Revised: 11/30/2015 Document Reviewed: 07/29/2014 Elsevier Interactive Patient Education  2017 [employer].

## 2022-02-02 IMAGING — MG DIGITAL SCREENING BILAT W/ CAD
4 series · 4 of 4 positions shown · non-contrast
Comparison: Previous exam(s).

CLINICAL DATA: Screening.

EXAM:
DIGITAL SCREENING BILATERAL MAMMOGRAM WITH CAD
TECHNIQUE: Bilateral screening digital craniocaudal and mediolateral oblique
mammograms were obtained. The images were evaluated with
computer-aided detection.

[L MLO]
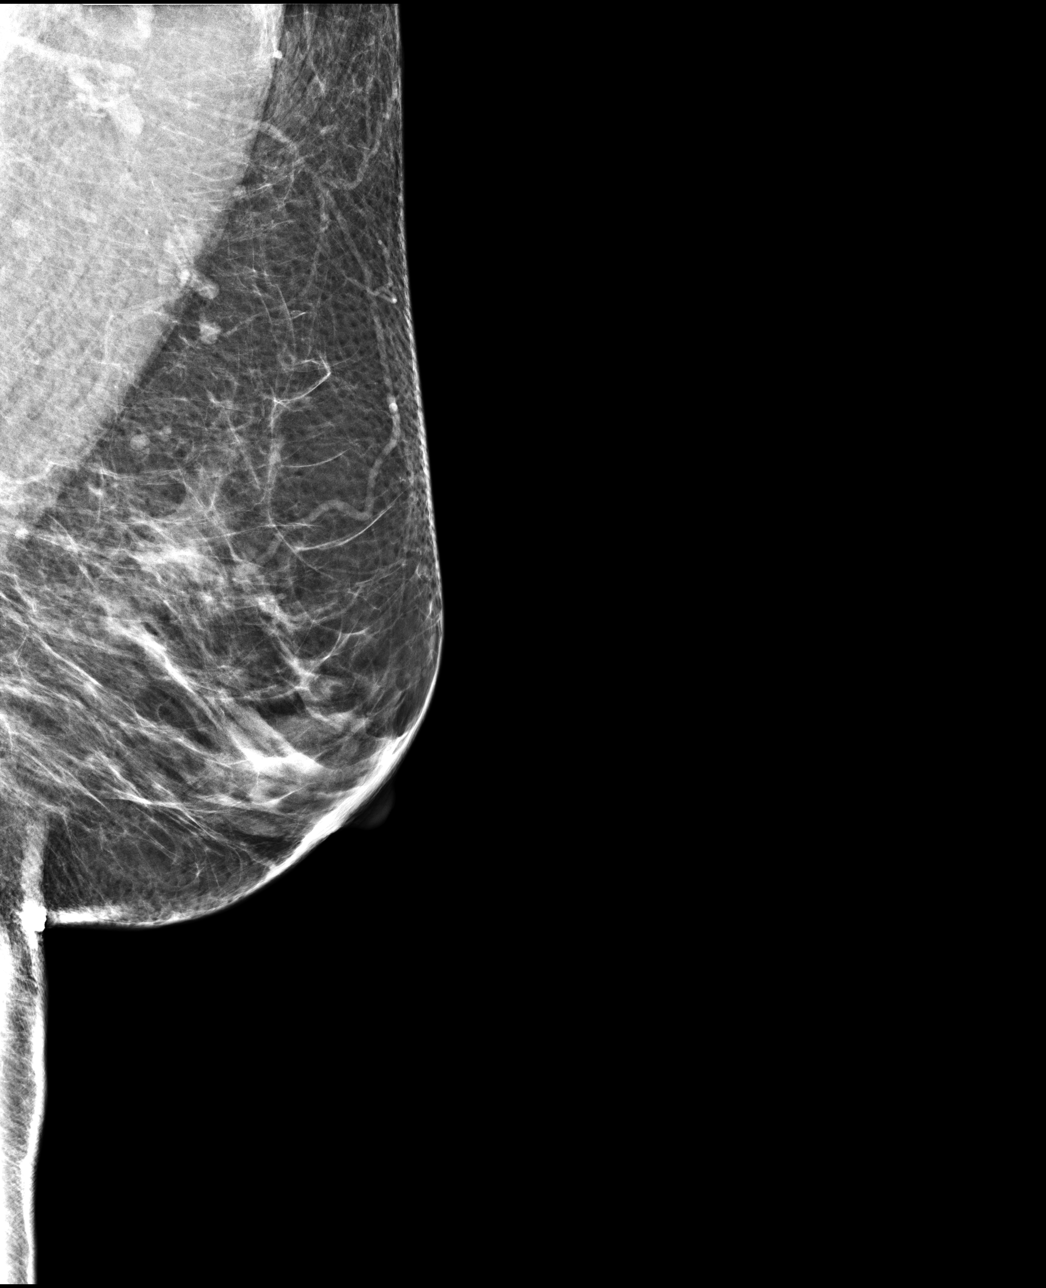

[L CC]
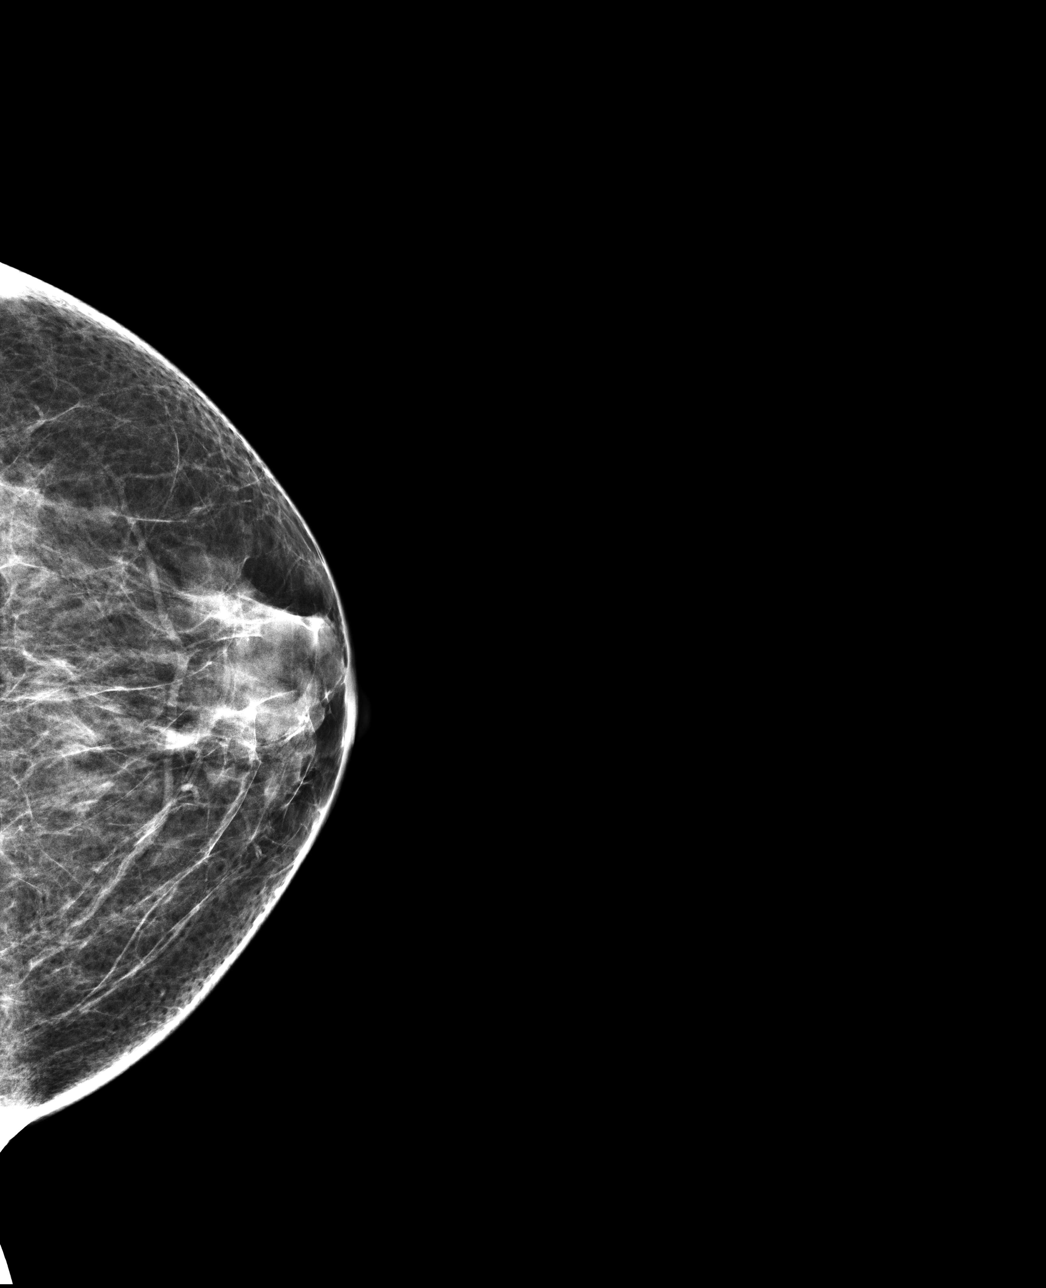

[R CC]
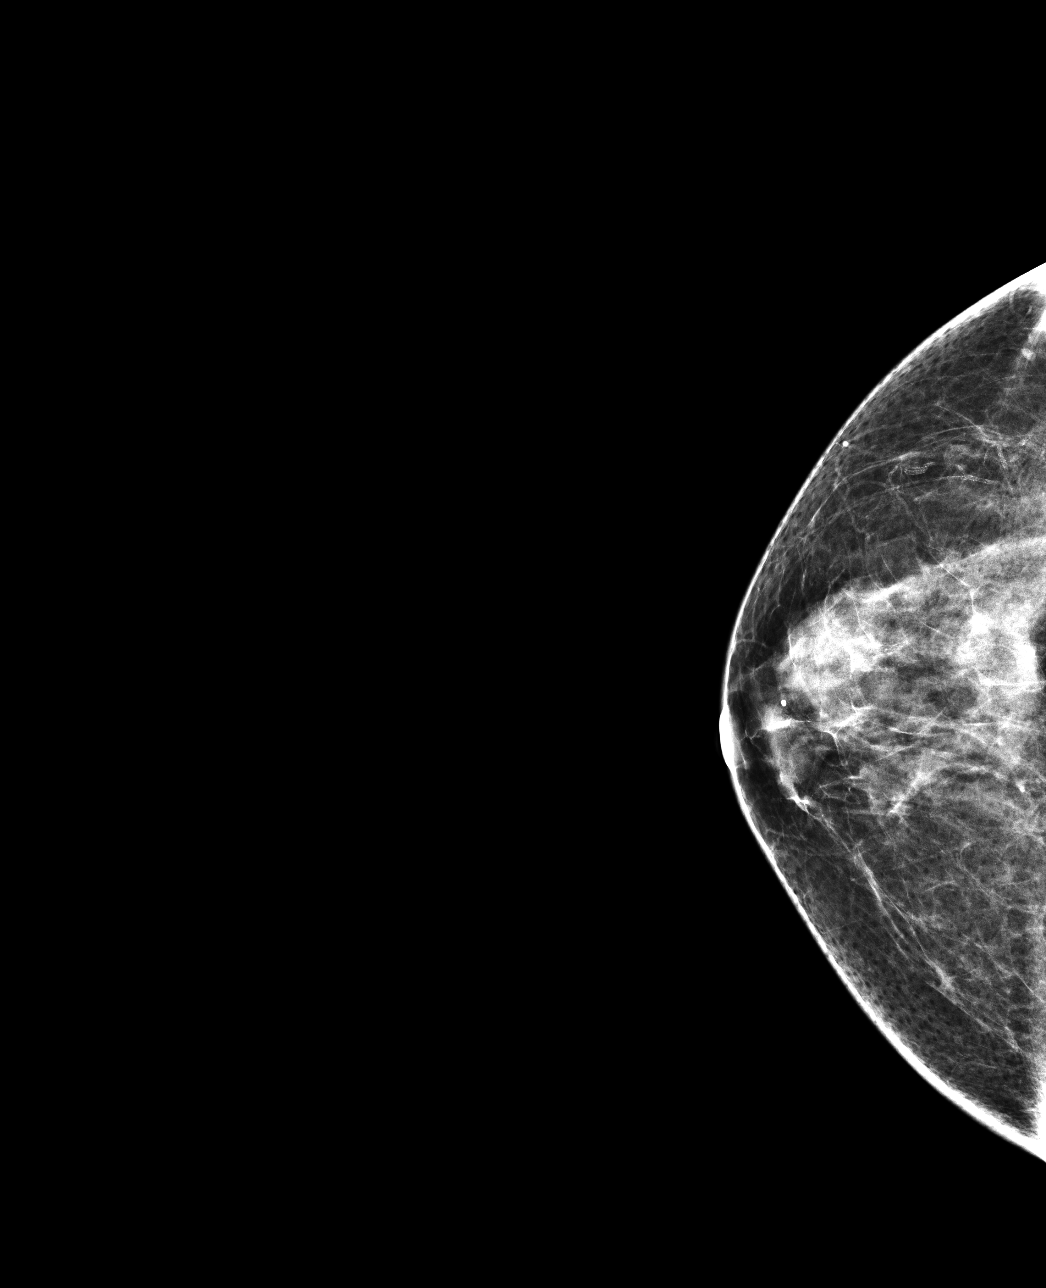

[R MLO]
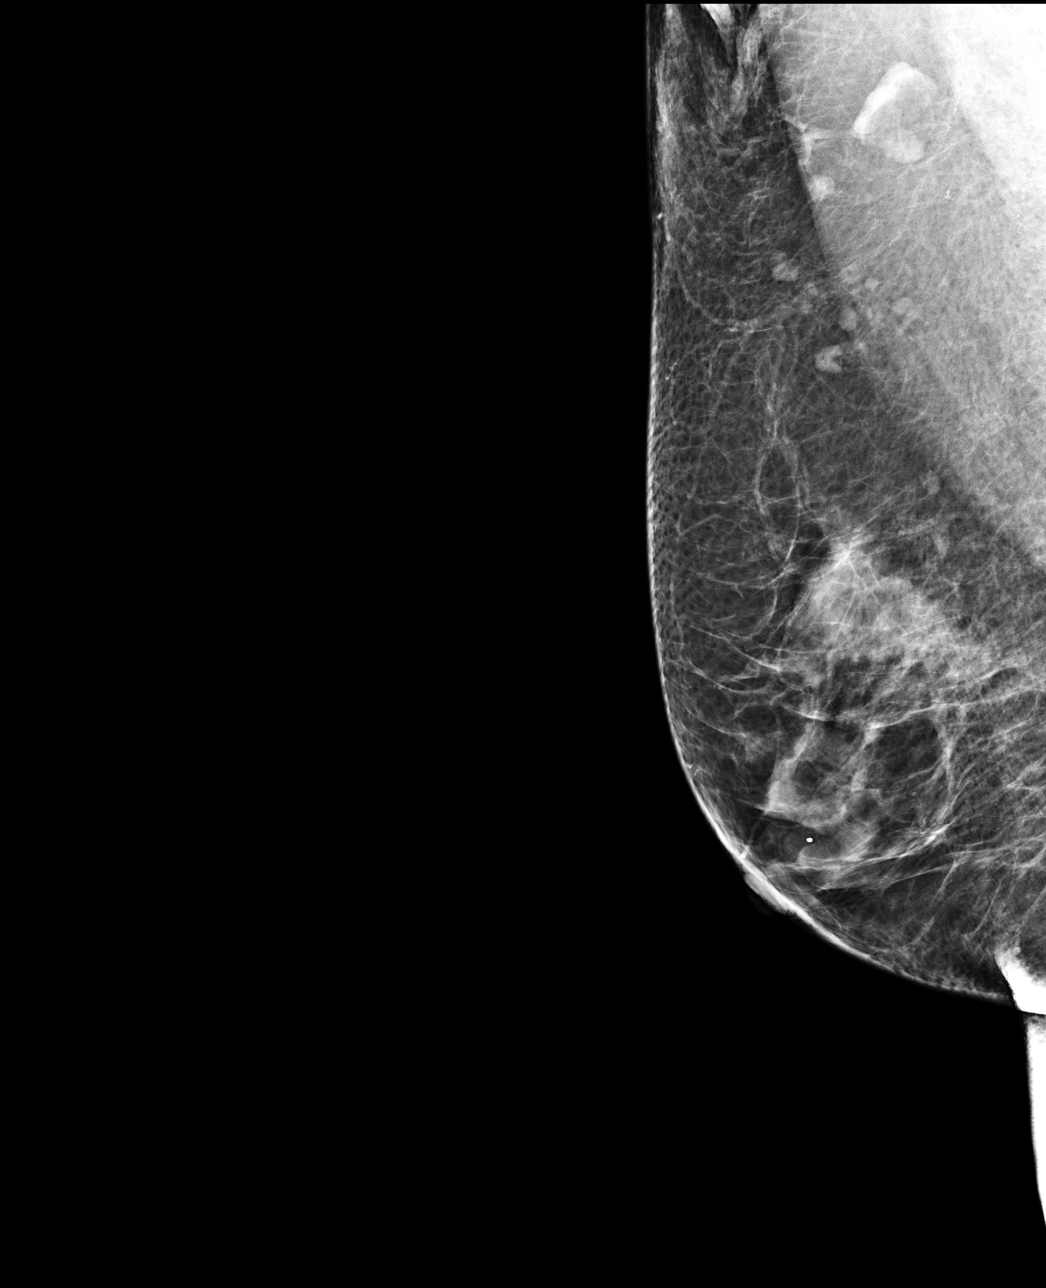

[4 of 4 positions shown; findings below may reference images not displayed]

ACR Breast Density Category c: The breast tissue is heterogeneously
dense, which may obscure small masses.
FINDINGS: There are no findings suspicious for malignancy. The images were
evaluated with computer-aided detection.
IMPRESSION: No mammographic evidence of malignancy. A result letter of this
screening mammogram will be mailed directly to the patient.

RECOMMENDATION:
Screening mammogram in one year. (Code:[id number]-W-Z4A)

BI-RADS CATEGORY  1: Negative.

## 2022-02-20 DIAGNOSIS — H903 Sensorineural hearing loss, bilateral: Secondary | ICD-10-CM | POA: Diagnosis not present

## 2022-11-26 ENCOUNTER — Other Ambulatory Visit: Payer: Self-pay | Admitting: Family Medicine

## 2022-11-26 DIAGNOSIS — Z1231 Encounter for screening mammogram for malignant neoplasm of breast: Secondary | ICD-10-CM

## 2022-12-04 ENCOUNTER — Ambulatory Visit
Admission: RE | Admit: 2022-12-04 | Discharge: 2022-12-04 | Disposition: A | Discharge status: Home / Self Care | Payer: Medicare HMO | Source: Ambulatory Visit | Attending: Family Medicine | Admitting: Family Medicine

## 2022-12-04 DIAGNOSIS — Z1231 Encounter for screening mammogram for malignant neoplasm of breast: Secondary | ICD-10-CM | POA: Diagnosis not present

## 2022-12-12 ENCOUNTER — Ambulatory Visit (INDEPENDENT_AMBULATORY_CARE_PROVIDER_SITE_OTHER): Payer: Medicare HMO

## 2022-12-12 ENCOUNTER — Other Ambulatory Visit: Payer: Medicare HMO

## 2022-12-12 ENCOUNTER — Encounter: Payer: Self-pay | Admitting: *Deleted

## 2022-12-12 VITALS — Ht 63.0 in | Wt 155.0 lb

## 2022-12-12 DIAGNOSIS — Z780 Asymptomatic menopausal state: Secondary | ICD-10-CM

## 2022-12-12 DIAGNOSIS — Z0000 Encounter for general adult medical examination without abnormal findings: Secondary | ICD-10-CM | POA: Diagnosis not present

## 2022-12-12 DIAGNOSIS — Z1211 Encounter for screening for malignant neoplasm of colon: Secondary | ICD-10-CM

## 2022-12-12 DIAGNOSIS — Z0100 Encounter for examination of eyes and vision without abnormal findings: Secondary | ICD-10-CM

## 2022-12-12 NOTE — Progress Notes (Signed)
Subjective:   Dana Gray is a 67 y.o. female who presents for Medicare Annual (Subsequent) preventive examination. I connected with  Dana Gray on 12/12/22 by a audio enabled telemedicine application and verified that I am speaking with the correct person using two identifiers.  Patient Location: Home  Provider Location: Home Office  I discussed the limitations of evaluation and management by telemedicine. The patient expressed understanding and agreed to proceed.  Review of Systems     Cardiac Risk Factors include: advanced age (>23men, >56 women);hypertension     Objective:    Today's Vitals   12/12/22 0817  Weight: 155 lb (70.3 kg)  Height: 5\' 3"  (1.6 m)   Body mass index is 27.46 kg/m.     12/12/2022    8:20 AM 12/07/2021    9:10 AM 12/06/2020    9:29 AM  Advanced Directives  Does Patient Have a Medical Advance Directive? Yes No No  Type of Estate agent of Dana Gray;Living will    Copy of Healthcare Power of Attorney in Chart? No - copy requested    Would patient like information on creating a medical advance directive?  No - Patient declined Yes (MAU/Ambulatory/Procedural Areas - Information given)    Current Medications (verified) Outpatient Encounter Medications as of 12/12/2022  Medication Sig   calcium-vitamin D (OSCAL 500/200 D-3) 500-200 MG-UNIT tablet Take 2 tablets by mouth daily with breakfast.   cetirizine (ZYRTEC) 10 MG tablet Take 10 mg by mouth daily.   lisinopril-hydrochlorothiazide (ZESTORETIC) 20-12.5 MG tablet Take 1 tablet by mouth daily.   Vitamin D, Ergocalciferol, (DRISDOL) 1.25 MG (50000 UNIT) CAPS capsule Take 1 capsule (50,000 Units total) by mouth every 7 (seven) days.   No facility-administered encounter medications on file as of 12/12/2022.    Allergies (verified) Patient has no known allergies.   History: Past Medical History:  Diagnosis Date   Chest pain    HTN (hypertension)    Past Surgical History:   Procedure Laterality Date   ABDOMINAL HYSTERECTOMY     SHOULDER SURGERY     TUBAL LIGATION  1982   Family History  Problem Relation Age of Onset   Hypertension Mother    Arthritis Mother    Emphysema Father    COPD Father    Heart failure Father    Breast cancer Sister    Social History   Socioeconomic History   Marital status: Widowed    Spouse name: Not on file   Number of children: 2   Years of education: Not on file   Highest education level: Not on file  Occupational History   Occupation: retired  Tobacco Use   Smoking status: Never   Smokeless tobacco: Never  Substance and Sexual Activity   Alcohol use: No   Drug use: No   Sexual activity: Not on file  Other Topics Concern   Not on file  Social History Narrative   Husband passed away 07/31/2021. Children live nearby - son and daughter   Social Determinants of Health   Financial Resource Strain: Low Risk  (12/12/2022)   Overall Financial Resource Strain (CARDIA)    Difficulty of Paying Living Expenses: Not hard at all  Food Insecurity: No Food Insecurity (12/12/2022)   Hunger Vital Sign    Worried About Running Out of Food in the Last Year: Never true    Ran Out of Food in the Last Year: Never true  Transportation Needs: No Transportation Needs (12/12/2022)  PRAPARE - Administrator, Civil Service (Medical): No    Lack of Transportation (Non-Medical): No  Physical Activity: Insufficiently Active (12/12/2022)   Exercise Vital Sign    Days of Exercise per Week: 2 days    Minutes of Exercise per Session: 60 min  Stress: No Stress Concern Present (12/12/2022)   [employer] of Occupational Health - Occupational Stress Questionnaire    Feeling of Stress : Not at all  Social Connections: Moderately Integrated (12/12/2022)   Social Connection and Isolation Panel [NHANES]    Frequency of Communication with Friends and Family: More than three times a week    Frequency of Social Gatherings with Friends  and Family: More than three times a week    Attends Religious Services: More than 4 times per year    Active Member of [employer] or Organizations: Yes    Attends Banker Meetings: 1 to 4 times per year    Marital Status: Widowed    Tobacco Counseling Counseling given: Not Answered   Clinical Intake:  Pre-visit preparation completed: Yes  Pain : No/denies pain     Nutritional Risks: None Diabetes: No  How often do you need to have someone help you when you read instructions, pamphlets, or other written materials from your doctor or pharmacy?: 1 - Never  Diabetic? No   Interpreter Needed?: No  Information entered by :: Renie Ora, LPN   Activities of Daily Living    12/12/2022    8:20 AM  In your present state of health, do you have any difficulty performing the following activities:  Hearing? 0  Vision? 0  Difficulty concentrating or making decisions? 0  Walking or climbing stairs? 0  Dressing or bathing? 0  Doing errands, shopping? 0  Preparing Food and eating ? N  Using the Toilet? N  In the past six months, have you accidently leaked urine? N  Do you have problems with loss of bowel control? N  Managing your Medications? N  Managing your Finances? N  Housekeeping or managing your Housekeeping? N    Patient Care Team: Gabriel Earing, FNP as PCP - General (Family Medicine)  Indicate any recent Medical Services you may have received from other than [facility] providers in the past year (date may be approximate).     Assessment:   This is a routine wellness examination for Dana Gray.  Hearing/Vision screen Vision Screening - Comments:: Referral 12/12/2022  Dietary issues and exercise activities discussed: Current Exercise Habits: Home exercise routine, Type of exercise: walking, Time (Minutes): 60, Frequency (Times/Week): 2, Weekly Exercise (Minutes/Week): 120, Intensity: Mild, Exercise limited by: None identified   Goals Addressed              This Visit's Progress    Exercise 3x per week (30 min per time)   On track    Weight bearing exercise for osteoporosis       Depression Screen    12/12/2022    8:19 AM 12/07/2021    9:03 AM 11/28/2021    9:02 AM 11/14/2021    8:57 AM 05/17/2021    1:18 PM 12/06/2020    9:28 AM 11/14/2020   10:04 AM  PHQ 2/9 Scores  PHQ - 2 Score 0 2 2 2  0 0 0  PHQ- 9 Score  7 8 5  0      Fall Risk    12/12/2022    8:18 AM 12/07/2021    9:00 AM 11/28/2021    9:02  AM 11/14/2021    8:55 AM 05/17/2021    1:15 PM  Fall Risk   Falls in the past year? 0 0 0 0 0  Number falls in past yr: 0 0     Injury with Fall? 0 0     Risk for fall due to : No Fall Risks No Fall Risks     Follow up Falls prevention discussed Falls prevention discussed       FALL RISK PREVENTION PERTAINING TO THE HOME:  Any stairs in or around the home? No  If so, are there any without handrails? No  Home free of loose throw rugs in walkways, pet beds, electrical cords, etc? Yes  Adequate lighting in your home to reduce risk of falls? Yes   ASSISTIVE DEVICES UTILIZED TO PREVENT FALLS:  Life alert? No  Use of a cane, walker or w/c? No  Grab bars in the bathroom? No  Shower chair or bench in shower? Yes  Elevated toilet seat or a handicapped toilet? No        12/12/2022    8:21 AM 12/07/2021    9:05 AM 12/06/2020    9:31 AM  6CIT Screen  What Year? 0 points 0 points 0 points  What month? 0 points 0 points 0 points  What time? 0 points 0 points 0 points  Count back from 20 0 points 0 points 0 points  Months in reverse 0 points 0 points 0 points  Repeat phrase 0 points 0 points 4 points  Total Score 0 points 0 points 4 points    Immunizations Immunization History  Administered Date(s) Administered   Td 02/15/2014   Tdap 02/15/2014    TDAP status: Up to date  Flu Vaccine status: Declined, Education has been provided regarding the importance of this vaccine but patient still declined. Advised may receive this vaccine at  local pharmacy or Health Dept. Aware to provide a copy of the vaccination record if obtained from local pharmacy or Health Dept. Verbalized acceptance and understanding.  Pneumococcal vaccine status: Declined,  Education has been provided regarding the importance of this vaccine but patient still declined. Advised may receive this vaccine at local pharmacy or Health Dept. Aware to provide a copy of the vaccination record if obtained from local pharmacy or Health Dept. Verbalized acceptance and understanding.   Covid-19 vaccine status: Declined, Education has been provided regarding the importance of this vaccine but patient still declined. Advised may receive this vaccine at local pharmacy or Health Dept.or vaccine clinic. Aware to provide a copy of the vaccination record if obtained from local pharmacy or Health Dept. Verbalized acceptance and understanding.  Qualifies for Shingles Vaccine? Yes   Zostavax completed No   Shingrix Completed?: No.    Education has been provided regarding the importance of this vaccine. Patient has been advised to call insurance company to determine out of pocket expense if they have not yet received this vaccine. Advised may also receive vaccine at local pharmacy or Health Dept. Verbalized acceptance and understanding.  Screening Tests Health Maintenance  Topic Date Due   COVID-19 Vaccine (1) Never done   Fecal DNA (Cologuard)  Never done   Pneumonia Vaccine 54+ Years old (1 of 1 - PCV) Never done   DEXA SCAN  10/05/2022   Zoster Vaccines- Shingrix (1 of 2) 02/14/2026 (Originally 09/14/2003)   INFLUENZA VACCINE  02/06/2023   MAMMOGRAM  12/04/2023   Medicare Annual Wellness (AWV)  12/12/2023   DTaP/Tdap/Td (3 - Td  or Tdap) 02/16/2024   Hepatitis C Screening  Completed   HPV VACCINES  Aged Out    Health Maintenance  Health Maintenance Due  Topic Date Due   COVID-19 Vaccine (1) Never done   Fecal DNA (Cologuard)  Never done   Pneumonia Vaccine 46+ Years old  (1 of 1 - PCV) Never done   DEXA SCAN  10/05/2022    Colorectal cancer screening: Referral to GI placed 12/12/2022. Pt aware the office will call re: appt.  Mammogram status: Completed 12/04/2022. Repeat every year  Bone Density status: Ordered 12/12/2022. Pt provided with contact info and advised to call to schedule appt.  Lung Cancer Screening: (Low Dose CT Chest recommended if Age 98-80 years, 30 pack-year currently smoking OR have quit w/in 15years.) does not qualify.   Lung Cancer Screening Referral: n/a  Additional Screening:  Hepatitis C Screening: does not qualify; Completed 04/072017  Vision Screening: Recommended annual ophthalmology exams for early detection of glaucoma and other disorders of the eye. Is the patient up to date with their annual eye exam?  No  Who is the provider or what is the name of the office in which the patient attends annual eye exams? None referral 12/12/2022 If pt is not established with a provider, would they like to be referred to a provider to establish care? No .   Dental Screening: Recommended annual dental exams for proper oral hygiene  Community Resource Referral / Chronic Care Management: CRR required this visit?  No   CCM required this visit?  No      Plan:     I have personally reviewed and noted the following in the patient's chart:   Medical and social history Use of alcohol, tobacco or illicit drugs  Current medications and supplements including opioid prescriptions. Patient is not currently taking opioid prescriptions. Functional ability and status Nutritional status Physical activity Advanced directives List of other physicians Hospitalizations, surgeries, and ER visits in previous 12 months Vitals Screenings to include cognitive, depression, and falls Referrals and appointments  In addition, I have reviewed and discussed with patient certain preventive protocols, quality metrics, and best practice recommendations. A  written personalized care plan for preventive services as well as general preventive health recommendations were provided to patient.     Lorrene Reid, LPN   4/0/9811   Nurse Notes: Declines all vaccines

## 2022-12-12 NOTE — Patient Instructions (Signed)
[name] , Thank you for taking time to come for your Medicare Wellness Visit. I appreciate your ongoing commitment to your health goals. Please review the following plan we discussed and let me know if I can assist you in the future.   These are the goals we discussed:  Goals      Exercise 3x per week (30 min per time)     Weight bearing exercise for osteoporosis        This is a list of the screening recommended for you and due dates:  Health Maintenance  Topic Date Due   COVID-19 Vaccine (1) Never done   Cologuard (Stool DNA test)  Never done   Pneumonia Vaccine (1 of 1 - PCV) Never done   DEXA scan (bone density measurement)  10/05/2022   Zoster (Shingles) Vaccine (1 of 2) 02/14/2026*   Flu Shot  02/06/2023   Mammogram  12/04/2023   Medicare Annual Wellness Visit  12/12/2023   DTaP/Tdap/Td vaccine (3 - Td or Tdap) 02/16/2024   Hepatitis C Screening  Completed   HPV Vaccine  Aged Out  *Topic was postponed. The date shown is not the original due date.    Advanced directives: Advance directive discussed with you today. I have provided a copy for you to complete at home and have notarized. Once this is complete please bring a copy in to our office so we can scan it into your chart.   Conditions/risks identified: Aim for 30 minutes of exercise or brisk walking, 6-8 glasses of water, and 5 servings of fruits and vegetables each day.   Next appointment: Follow up in one year for your annual wellness visit    Preventive Care 65 Years and Older, Female Preventive care refers to lifestyle choices and visits with your health care provider that can promote health and wellness. What does preventive care include? A yearly physical exam. This is also called an annual well check. Dental exams once or twice a year. Routine eye exams. Ask your health care provider how often you should have your eyes checked. Personal lifestyle choices, including: Daily care of your teeth and  gums. Regular physical activity. Eating a healthy diet. Avoiding tobacco and drug use. Limiting alcohol use. Practicing safe sex. Taking low-dose aspirin every day. Taking vitamin and mineral supplements as recommended by your health care provider. What happens during an annual well check? The services and screenings done by your health care provider during your annual well check will depend on your age, overall health, lifestyle risk factors, and family history of disease. Counseling  Your health care provider may ask you questions about your: Alcohol use. Tobacco use. Drug use. Emotional well-being. Home and relationship well-being. Sexual activity. Eating habits. History of falls. Memory and ability to understand (cognition). Work and work Astronomer. Reproductive health. Screening  You may have the following tests or measurements: Height, weight, and BMI. Blood pressure. Lipid and cholesterol levels. These may be checked every 5 years, or more frequently if you are over 40 years old. Skin check. Lung cancer screening. You may have this screening every year starting at age 80 if you have a 30-pack-year history of smoking and currently smoke or have quit within the past 15 years. Fecal occult blood test (FOBT) of the stool. You may have this test every year starting at age 46. Flexible sigmoidoscopy or colonoscopy. You may have a sigmoidoscopy every 5 years or a colonoscopy every 10 years starting at age 26. Hepatitis C blood  test. Hepatitis B blood test. Sexually transmitted disease (STD) testing. Diabetes screening. This is done by checking your blood sugar (glucose) after you have not eaten for a while (fasting). You may have this done every 1-3 years. Bone density scan. This is done to screen for osteoporosis. You may have this done starting at age 75. Mammogram. This may be done every 1-2 years. Talk to your health care provider about how often you should have regular  mammograms. Talk with your health care provider about your test results, treatment options, and if necessary, the need for more tests. Vaccines  Your health care provider may recommend certain vaccines, such as: Influenza vaccine. This is recommended every year. Tetanus, diphtheria, and acellular pertussis (Tdap, Td) vaccine. You may need a Td booster every 10 years. Zoster vaccine. You may need this after age 29. Pneumococcal 13-valent conjugate (PCV13) vaccine. One dose is recommended after age 84. Pneumococcal polysaccharide (PPSV23) vaccine. One dose is recommended after age 39. Talk to your health care provider about which screenings and vaccines you need and how often you need them. This information is not intended to replace advice given to you by your health care provider. Make sure you discuss any questions you have with your health care provider. Document Released: 07/21/2015 Document Revised: 03/13/2016 Document Reviewed: 04/25/2015 Elsevier Interactive Patient Education  2017 [employer].  Fall Prevention in the Home Falls can cause injuries. They can happen to people of all ages. There are many things you can do to make your home safe and to help prevent falls. What can I do on the outside of my home? Regularly fix the edges of walkways and driveways and fix any cracks. Remove anything that might make you trip as you walk through a door, such as a raised step or threshold. Trim any bushes or trees on the path to your home. Use bright outdoor lighting. Clear any walking paths of anything that might make someone trip, such as rocks or tools. Regularly check to see if handrails are loose or broken. Make sure that both sides of any steps have handrails. Any raised decks and porches should have guardrails on the edges. Have any leaves, snow, or ice cleared regularly. Use sand or salt on walking paths during winter. Clean up any spills in your garage right away. This includes oil  or grease spills. What can I do in the bathroom? Use night lights. Install grab bars by the toilet and in the tub and shower. Do not use towel bars as grab bars. Use non-skid mats or decals in the tub or shower. If you need to sit down in the shower, use a plastic, non-slip stool. Keep the floor dry. Clean up any water that spills on the floor as soon as it happens. Remove soap buildup in the tub or shower regularly. Attach bath mats securely with double-sided non-slip rug tape. Do not have throw rugs and other things on the floor that can make you trip. What can I do in the bedroom? Use night lights. Make sure that you have a light by your bed that is easy to reach. Do not use any sheets or blankets that are too big for your bed. They should not hang down onto the floor. Have a firm chair that has side arms. You can use this for support while you get dressed. Do not have throw rugs and other things on the floor that can make you trip. What can I do in the kitchen? Clean  up any spills right away. Avoid walking on wet floors. Keep items that you use a lot in easy-to-reach places. If you need to reach something above you, use a strong step stool that has a grab bar. Keep electrical cords out of the way. Do not use floor polish or wax that makes floors slippery. If you must use wax, use non-skid floor wax. Do not have throw rugs and other things on the floor that can make you trip. What can I do with my stairs? Do not leave any items on the stairs. Make sure that there are handrails on both sides of the stairs and use them. Fix handrails that are broken or loose. Make sure that handrails are as long as the stairways. Check any carpeting to make sure that it is firmly attached to the stairs. Fix any carpet that is loose or worn. Avoid having throw rugs at the top or bottom of the stairs. If you do have throw rugs, attach them to the floor with carpet tape. Make sure that you have a light  switch at the top of the stairs and the bottom of the stairs. If you do not have them, ask someone to add them for you. What else can I do to help prevent falls? Wear shoes that: Do not have high heels. Have rubber bottoms. Are comfortable and fit you well. Are closed at the toe. Do not wear sandals. If you use a stepladder: Make sure that it is fully opened. Do not climb a closed stepladder. Make sure that both sides of the stepladder are locked into place. Ask someone to hold it for you, if possible. Clearly mark and make sure that you can see: Any grab bars or handrails. First and last steps. Where the edge of each step is. Use tools that help you move around (mobility aids) if they are needed. These include: Canes. Walkers. Scooters. Crutches. Turn on the lights when you go into a dark area. Replace any light bulbs as soon as they burn out. Set up your furniture so you have a clear path. Avoid moving your furniture around. If any of your floors are uneven, fix them. If there are any pets around you, be aware of where they are. Review your medicines with your doctor. Some medicines can make you feel dizzy. This can increase your chance of falling. Ask your doctor what other things that you can do to help prevent falls. This information is not intended to replace advice given to you by your health care provider. Make sure you discuss any questions you have with your health care provider. Document Released: 04/20/2009 Document Revised: 11/30/2015 Document Reviewed: 07/29/2014 Elsevier Interactive Patient Education  2017 [employer].

## 2022-12-16 NOTE — Progress Notes (Signed)
I have reviewed the AWV documentation and agree with the written assessment and plan of care.  Lataunya Ruud, FNP-C [facility]  

## 2023-01-13 ENCOUNTER — Telehealth: Payer: Self-pay | Admitting: Internal Medicine

## 2023-01-13 NOTE — Telephone Encounter (Signed)
Questionnaire received and in review 

## 2023-01-14 ENCOUNTER — Other Ambulatory Visit: Payer: Self-pay | Admitting: Family Medicine

## 2023-01-14 DIAGNOSIS — I10 Essential (primary) hypertension: Secondary | ICD-10-CM

## 2023-01-20 ENCOUNTER — Telehealth: Payer: Self-pay | Admitting: *Deleted

## 2023-01-20 NOTE — Telephone Encounter (Signed)
  Procedure: colonoscopy  Height: 5'3" Weight: 159 lb      BMI: 28.2  Have you had a colonoscopy before?  no  Do you have family history of colon cancer?  no  Do you have a family history of polyps? yes  Previous colonoscopy with polyps removed? no  Do you have a history colorectal cancer?   no  Are you diabetic?  no  Do you have a prosthetic or mechanical heart valve? no  Do you have a pacemaker/defibrillator?   no  Have you had endocarditis/atrial fibrillation?  no  Do you use supplemental oxygen/CPAP?  no  Have you had joint replacement within the last 12 months?  no  Do you tend to be constipated or have to use laxatives?  no   Do you have history of alcohol use? If yes, how much and how often.  no  Do you have history or are you using drugs? If yes, what do are you  using?  no  Have you ever had a stroke/heart attack?  no  Have you ever had a heart or other vascular stent placed,?no  Do you take weight loss medication? no  female patients,: have you had a hysterectomy? yes                              are you post menopausal?  yes                              do you still have your menstrual cycle? no    Date of last menstrual period? 1980/s  Do you take any blood-thinning medications such as: (Plavix, aspirin, Coumadin, Aggrenox, Brilinta, Xarelto, Eliquis, Pradaxa, Savaysa or Effient)? no  If yes we need the name, milligram, dosage and who is prescribing doctor:  n/a             Current Outpatient Medications  Medication Sig Dispense Refill   calcium-vitamin D (OSCAL 500/200 D-3) 500-200 MG-UNIT tablet Take 2 tablets by mouth daily with breakfast. 180 tablet 0   cetirizine (ZYRTEC) 10 MG tablet Take 10 mg by mouth daily.     lisinopril (ZESTRIL) 20 MG tablet Take 20 mg by mouth daily.     No current facility-administered medications for this visit.    No Known Allergies

## 2023-03-17 ENCOUNTER — Ambulatory Visit (INDEPENDENT_AMBULATORY_CARE_PROVIDER_SITE_OTHER): Payer: Medicare HMO | Admitting: Family Medicine

## 2023-03-17 ENCOUNTER — Ambulatory Visit (INDEPENDENT_AMBULATORY_CARE_PROVIDER_SITE_OTHER): Payer: Medicare HMO

## 2023-03-17 ENCOUNTER — Encounter: Payer: Self-pay | Admitting: Family Medicine

## 2023-03-17 VITALS — BP 110/65 | HR 89 | Temp 98.3°F | Ht 63.0 in | Wt 158.4 lb

## 2023-03-17 DIAGNOSIS — M79641 Pain in right hand: Secondary | ICD-10-CM

## 2023-03-17 DIAGNOSIS — I10 Essential (primary) hypertension: Secondary | ICD-10-CM

## 2023-03-17 DIAGNOSIS — M810 Age-related osteoporosis without current pathological fracture: Secondary | ICD-10-CM | POA: Diagnosis not present

## 2023-03-17 DIAGNOSIS — Z780 Asymptomatic menopausal state: Secondary | ICD-10-CM | POA: Diagnosis not present

## 2023-03-17 DIAGNOSIS — Z1211 Encounter for screening for malignant neoplasm of colon: Secondary | ICD-10-CM | POA: Diagnosis not present

## 2023-03-17 DIAGNOSIS — Z0000 Encounter for general adult medical examination without abnormal findings: Secondary | ICD-10-CM

## 2023-03-17 DIAGNOSIS — M79642 Pain in left hand: Secondary | ICD-10-CM

## 2023-03-17 DIAGNOSIS — Z0001 Encounter for general adult medical examination with abnormal findings: Secondary | ICD-10-CM

## 2023-03-17 DIAGNOSIS — L819 Disorder of pigmentation, unspecified: Secondary | ICD-10-CM | POA: Diagnosis not present

## 2023-03-17 DIAGNOSIS — E782 Mixed hyperlipidemia: Secondary | ICD-10-CM | POA: Diagnosis not present

## 2023-03-17 DIAGNOSIS — E663 Overweight: Secondary | ICD-10-CM

## 2023-03-17 DIAGNOSIS — E559 Vitamin D deficiency, unspecified: Secondary | ICD-10-CM | POA: Diagnosis not present

## 2023-03-17 DIAGNOSIS — J309 Allergic rhinitis, unspecified: Secondary | ICD-10-CM | POA: Diagnosis not present

## 2023-03-17 MED ORDER — FLUTICASONE PROPIONATE 50 MCG/ACT NA SUSP
2.0000 | Freq: Every day | NASAL | 6 refills | Status: AC
Start: 2023-03-17 — End: ?

## 2023-03-17 NOTE — Patient Instructions (Signed)

## 2023-03-17 NOTE — Progress Notes (Signed)
Complete physical exam  Patient: Dana Gray   DOB: 1955-12-20   67 y.o. Female  MRN: [id number]  Subjective:    Chief Complaint  Patient presents with   Annual Exam    Dana Gray is a 67 y.o. female who presents today for a complete physical exam. She reports consuming a general diet. The patient does not participate in regular exercise at present. She tries to be active around the house and with yard work. She also works part time in [employer]. She generally feels fairly well. She reports sleeping poorly. She does have additional problems to discuss today.   She has been having pain in her hands for the last 6-8 months. Believes she has some swelling at times. Has knots on her joints. Also has pain in her feet intermittently. Pain is intermittent. No always worse with activity. Hands also hurt worse in the cold. 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   She has chronic runny nose. Uses flonase with relief. Would like this sent in.   Has a mole on her right side of her upper back for years. This has gotten larger and is itchy at times.   Most recent fall risk assessment:    12/12/2022    8:18 AM  Fall Risk   Falls in the past year? 0  Number falls in past yr: 0  Injury with Fall? 0  Risk for fall due to : No Fall Risks  Follow up Falls prevention discussed     Most recent depression screenings:    03/17/2023    2:11 PM 12/12/2022    8:19 AM  PHQ 2/9 Scores  PHQ - 2 Score 0 0  PHQ- 9 Score 3     Vision:Not within last year  and Dental: No current dental problems and No regular dental care   Past Medical History:  Diagnosis Date   Chest pain    HTN (hypertension)       Patient Care Team: Gabriel Earing, FNP as PCP - General (Family Medicine)   Outpatient Medications Prior to Visit  Medication Sig   cetirizine (ZYRTEC) 10 MG tablet Take 10 mg by mouth daily.   lisinopril-hydrochlorothiazide (ZESTORETIC) 20-12.5 MG tablet Take 1  tablet by mouth daily.   [DISCONTINUED] calcium-vitamin D (OSCAL 500/200 D-3) 500-200 MG-UNIT tablet Take 2 tablets by mouth daily with breakfast.   [DISCONTINUED] lisinopril (ZESTRIL) 20 MG tablet Take 20 mg by mouth daily.   No facility-administered medications prior to visit.    ROS        Objective:     BP 110/65   Pulse 89   Temp 98.3 F (36.8 C) (Temporal)   Ht 5\' 3"  (1.6 m)   Wt 158 lb 6 oz (71.8 kg)   SpO2 97%   BMI 28.05 kg/m    Physical Exam Vitals and nursing note reviewed.  Constitutional:      General: She is not in acute distress.    Appearance: She is not ill-appearing, toxic-appearing or diaphoretic.  HENT:     Head: Normocephalic.     Right Ear: Tympanic membrane, ear canal and external ear normal.     Left Ear: Tympanic membrane, ear canal and external ear normal.     Nose: Nose normal.     Mouth/Throat:     Mouth: Mucous membranes are moist.     Pharynx: Oropharynx is clear.  Eyes:     Extraocular  Movements: Extraocular movements intact.     Conjunctiva/sclera: Conjunctivae normal.     Pupils: Pupils are equal, round, and reactive to light.  Neck:     Thyroid: No thyroid mass, thyromegaly or thyroid tenderness.  Cardiovascular:     Rate and Rhythm: Normal rate and regular rhythm.     Pulses: Normal pulses.     Heart sounds: Normal heart sounds. No murmur heard.    No friction rub. No gallop.  Pulmonary:     Effort: Pulmonary effort is normal.     Breath sounds: Normal breath sounds.  Abdominal:     General: Bowel sounds are normal. There is no distension.     Palpations: Abdomen is soft. There is no mass.     Tenderness: There is no abdominal tenderness. There is no guarding.  Musculoskeletal:        General: No swelling or tenderness. Normal range of motion.     Cervical back: Normal range of motion and neck supple. No tenderness.     Right lower leg: No edema.     Left lower leg: No edema.     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  Skin:    General: Skin is warm and dry.     Capillary Refill: Capillary refill takes less than 2 seconds.     Findings: Lesion (Righter upper back, see picture below) present. No rash.  Neurological:     General: No focal deficit present.     Mental Status: She is alert and oriented to person, place, and time.     Cranial Nerves: No cranial nerve deficit.     Motor: No weakness.     Gait: Gait normal.  Psychiatric:        Mood and Affect: Mood normal.        Behavior: Behavior normal.        Thought Content: Thought content normal.        Judgment: Judgment normal.      No results found for any visits on 03/17/23.     Assessment & Plan:    Routine Health Maintenance and Physical Exam  Jaquan was seen today for annual exam.  Diagnoses and all orders for this visit:  Routine general medical examination at a health care facility  Colon cancer screening -     Ambulatory referral to Gastroenterology  Primary hypertension Well controlled on current regimen. Labs pending.  -     CBC with Differential/Platelet; Future -     CMP14+EGFR; Future -     TSH; Future  Mixed hyperlipidemia She will return for fasting labs.  -     Lipid panel; Future  Vitamin D deficiency Not currently on a supplement. Labs pending.  -     VITAMIN D 25 Hydroxy (Vit-D Deficiency, Fractures); Future  Age-related osteoporosis without current pathological fracture DEXA today.   Overweight (BMI 25.0-29.9) Diet and exercise.   Allergic rhinitis, unspecified seasonality, unspecified trigger Well controlled on current regimen.  -     fluticasone (FLONASE) 50 MCG/ACT nasal spray; Place 2 sprays into both nostrils daily.  Atypical pigmented skin lesion Referral to dermatology for further evaluation.  -     Ambulatory referral to Dermatology  Pain in both hands Discussed OA and symptomatic management.    Immunization History  Administered Date(s) Administered    Td 02/15/2014   Tdap 02/15/2014    Health Maintenance  Topic Date Due   Fecal DNA (Cologuard)  Never  done   DEXA SCAN  10/05/2022   INFLUENZA VACCINE  10/06/2023 (Originally 02/06/2023)   Pneumonia Vaccine 57+ Years old (1 of 1 - PCV) 03/16/2024 (Originally 09/14/2018)   COVID-19 Vaccine (1 - 2023-24 season) 04/01/2024 (Originally 03/09/2023)   Zoster Vaccines- Shingrix (1 of 2) 02/14/2026 (Originally 09/14/2003)   MAMMOGRAM  12/04/2023   Medicare Annual Wellness (AWV)  12/12/2023   DTaP/Tdap/Td (3 - Td or Tdap) 02/16/2024   Hepatitis C Screening  Completed   HPV VACCINES  Aged Out    Discussed health benefits of physical activity, and encouraged her to engage in regular exercise appropriate for her age and condition.  Problem List Items Addressed This Visit       Cardiovascular and Mediastinum   Primary hypertension   Relevant Medications   lisinopril-hydrochlorothiazide (ZESTORETIC) 20-12.5 MG tablet   Other Relevant Orders   CBC with Differential/Platelet   CMP14+EGFR   TSH     Musculoskeletal and Integument   Age-related osteoporosis without current pathological fracture     Other   Hyperlipidemia   Relevant Medications   lisinopril-hydrochlorothiazide (ZESTORETIC) 20-12.5 MG tablet   Other Relevant Orders   Lipid panel   Vitamin D deficiency   Relevant Orders   VITAMIN D 25 Hydroxy (Vit-D Deficiency, Fractures)   Overweight (BMI 25.0-29.9)   Other Visit Diagnoses     Routine general medical examination at a health care facility    -  Primary   Colon cancer screening       Relevant Orders   Ambulatory referral to Gastroenterology   Allergic rhinitis, unspecified seasonality, unspecified trigger       Relevant Medications   fluticasone (FLONASE) 50 MCG/ACT nasal spray   Atypical pigmented skin lesion       Relevant Orders   Ambulatory referral to Dermatology   Pain in both hands          Return in about 6 months (around 09/14/2023) for chronic follow up.    The patient indicates understanding of these issues and agrees with the plan.  Gabriel Earing, FNP

## 2023-03-18 ENCOUNTER — Other Ambulatory Visit: Payer: Medicare HMO

## 2023-03-18 DIAGNOSIS — E559 Vitamin D deficiency, unspecified: Secondary | ICD-10-CM

## 2023-03-18 DIAGNOSIS — E782 Mixed hyperlipidemia: Secondary | ICD-10-CM

## 2023-03-18 DIAGNOSIS — Z780 Asymptomatic menopausal state: Secondary | ICD-10-CM | POA: Diagnosis not present

## 2023-03-18 DIAGNOSIS — M810 Age-related osteoporosis without current pathological fracture: Secondary | ICD-10-CM | POA: Diagnosis not present

## 2023-03-18 DIAGNOSIS — I10 Essential (primary) hypertension: Secondary | ICD-10-CM | POA: Diagnosis not present

## 2023-03-19 LAB — CMP14+EGFR
ALT: 15 IU/L (ref 0–32)
AST: 20 IU/L (ref 0–40)
Albumin: 4.4 g/dL (ref 3.9–4.9)
Alkaline Phosphatase: 105 IU/L (ref 44–121)
BUN/Creatinine Ratio: 23 (ref 12–28)
BUN: 22 mg/dL (ref 8–27)
Bilirubin Total: 0.3 mg/dL (ref 0.0–1.2)
CO2: 22 mmol/L (ref 20–29)
Calcium: 9.6 mg/dL (ref 8.7–10.3)
Chloride: 100 mmol/L (ref 96–106)
Creatinine, Ser: 0.97 mg/dL (ref 0.57–1.00)
Globulin, Total: 2.6 g/dL (ref 1.5–4.5)
Glucose: 102 mg/dL — ABNORMAL HIGH (ref 70–99)
Potassium: 4.4 mmol/L (ref 3.5–5.2)
Sodium: 138 mmol/L (ref 134–144)
Total Protein: 7 g/dL (ref 6.0–8.5)
eGFR: 63 mL/min/{1.73_m2} (ref >59)

## 2023-03-19 LAB — CBC WITH DIFFERENTIAL/PLATELET
Basophils Absolute: 0.1 10*3/uL (ref 0.0–0.2)
Basos: 1 %
EOS (ABSOLUTE): 0.2 10*3/uL (ref 0.0–0.4)
Eos: 4 %
Hematocrit: 38.6 % (ref 34.0–46.6)
Hemoglobin: 12.5 g/dL (ref 11.1–15.9)
Immature Grans (Abs): 0 10*3/uL (ref 0.0–0.1)
Immature Granulocytes: 0 %
Lymphocytes Absolute: 1.3 10*3/uL (ref 0.7–3.1)
Lymphs: 20 %
MCH: 28.6 pg (ref 26.6–33.0)
MCHC: 32.4 g/dL (ref 31.5–35.7)
MCV: 88 fL (ref 79–97)
Monocytes Absolute: 0.5 10*3/uL (ref 0.1–0.9)
Monocytes: 8 %
Neutrophils Absolute: 4.4 10*3/uL (ref 1.4–7.0)
Neutrophils: 67 %
Platelets: 345 10*3/uL (ref 150–450)
RBC: 4.37 x10E6/uL (ref 3.77–5.28)
RDW: 12.3 % (ref 11.7–15.4)
WBC: 6.5 10*3/uL (ref 3.4–10.8)

## 2023-03-19 LAB — LIPID PANEL
Chol/HDL Ratio: 2.3 ratio (ref 0.0–4.4)
Cholesterol, Total: 189 mg/dL (ref 100–199)
HDL: 83 mg/dL (ref >39)
LDL Chol Calc (NIH): 95 mg/dL (ref 0–99)
Triglycerides: 60 mg/dL (ref 0–149)
VLDL Cholesterol Cal: 11 mg/dL (ref 5–40)

## 2023-03-19 LAB — VITAMIN D 25 HYDROXY (VIT D DEFICIENCY, FRACTURES): Vit D, 25-Hydroxy: 32.3 ng/mL (ref 30.0–100.0)

## 2023-03-19 LAB — TSH: TSH: 2.34 u[IU]/mL (ref 0.450–4.500)

## 2023-03-19 NOTE — Telephone Encounter (Signed)
Okay to schedule. ASA 2 

## 2023-03-19 NOTE — Telephone Encounter (Signed)
 Will call to schedule once we receive future schedule

## 2023-03-25 MED ORDER — PEG 3350-KCL-NA BICARB-NACL 420 G PO SOLR: 4000.0000 mL | Freq: Once | ORAL | 0 refills | Status: AC

## 2023-03-25 NOTE — Addendum Note (Signed)
Addended by: Armstead Peaks on: 03/25/2023 01:27 PM   Modules accepted: Orders

## 2023-03-25 NOTE — Telephone Encounter (Signed)
Spoke with pt. Scheduled with Dr. Marletta Lor 10/1. Aware will send instructions via mychart.. rx for prep sent to pharmacy.

## 2023-03-26 ENCOUNTER — Encounter: Payer: Self-pay | Admitting: *Deleted

## 2023-03-26 NOTE — Telephone Encounter (Signed)
Referral completed

## 2023-03-31 ENCOUNTER — Other Ambulatory Visit: Payer: Self-pay | Admitting: Family Medicine

## 2023-03-31 DIAGNOSIS — M810 Age-related osteoporosis without current pathological fracture: Secondary | ICD-10-CM

## 2023-03-31 MED ORDER — ALENDRONATE SODIUM 70 MG PO TABS
70.0000 mg | ORAL_TABLET | ORAL | 3 refills | Status: DC
Start: 2023-03-31 — End: 2024-03-04

## 2023-04-08 ENCOUNTER — Encounter (HOSPITAL_COMMUNITY)
Admission: RE | Disposition: A | Discharge status: Home / Self Care | Payer: Self-pay | Source: Home / Self Care | Attending: Internal Medicine

## 2023-04-08 ENCOUNTER — Ambulatory Visit (HOSPITAL_COMMUNITY)
Admission: RE | Admit: 2023-04-08 | Discharge: 2023-04-08 | Disposition: A | Discharge status: Home / Self Care | Payer: Medicare HMO | Attending: Internal Medicine | Admitting: Internal Medicine

## 2023-04-08 ENCOUNTER — Encounter (HOSPITAL_COMMUNITY): Payer: Self-pay

## 2023-04-08 ENCOUNTER — Other Ambulatory Visit: Payer: Self-pay

## 2023-04-08 ENCOUNTER — Ambulatory Visit (HOSPITAL_BASED_OUTPATIENT_CLINIC_OR_DEPARTMENT_OTHER): Payer: Medicare HMO | Admitting: Anesthesiology

## 2023-04-08 ENCOUNTER — Ambulatory Visit (HOSPITAL_COMMUNITY): Payer: Medicare HMO | Admitting: Anesthesiology

## 2023-04-08 DIAGNOSIS — Z139 Encounter for screening, unspecified: Secondary | ICD-10-CM | POA: Diagnosis not present

## 2023-04-08 DIAGNOSIS — D123 Benign neoplasm of transverse colon: Secondary | ICD-10-CM | POA: Insufficient documentation

## 2023-04-08 DIAGNOSIS — D120 Benign neoplasm of cecum: Secondary | ICD-10-CM | POA: Insufficient documentation

## 2023-04-08 DIAGNOSIS — Z1211 Encounter for screening for malignant neoplasm of colon: Secondary | ICD-10-CM

## 2023-04-08 DIAGNOSIS — K5730 Diverticulosis of large intestine without perforation or abscess without bleeding: Secondary | ICD-10-CM | POA: Insufficient documentation

## 2023-04-08 DIAGNOSIS — I10 Essential (primary) hypertension: Secondary | ICD-10-CM | POA: Insufficient documentation

## 2023-04-08 DIAGNOSIS — K648 Other hemorrhoids: Secondary | ICD-10-CM | POA: Diagnosis not present

## 2023-04-08 DIAGNOSIS — D126 Benign neoplasm of colon, unspecified: Secondary | ICD-10-CM | POA: Diagnosis not present

## 2023-04-08 DIAGNOSIS — K635 Polyp of colon: Secondary | ICD-10-CM

## 2023-04-08 HISTORY — DX: Other specified postprocedural states: Z98.890

## 2023-04-08 HISTORY — DX: Nausea with vomiting, unspecified: R11.2

## 2023-04-08 HISTORY — PX: POLYPECTOMY: SHX5525

## 2023-04-08 HISTORY — PX: COLONOSCOPY WITH PROPOFOL: SHX5780

## 2023-04-08 SURGERY — COLONOSCOPY WITH PROPOFOL
Anesthesia: General

## 2023-04-08 MED ORDER — PHENYLEPHRINE 80 MCG/ML (10ML) SYRINGE FOR IV PUSH (FOR BLOOD PRESSURE SUPPORT)
PREFILLED_SYRINGE | INTRAVENOUS | Status: DC | PRN
Start: 2023-04-08 — End: 2023-04-08
  Administered 2023-04-08 (×4): 160 ug via INTRAVENOUS

## 2023-04-08 MED ORDER — EPHEDRINE SULFATE-NACL 50-0.9 MG/10ML-% IV SOSY
PREFILLED_SYRINGE | INTRAVENOUS | Status: DC | PRN
Start: 2023-04-08 — End: 2023-04-08
  Administered 2023-04-08: 5 mg via INTRAVENOUS

## 2023-04-08 MED ORDER — LACTATED RINGERS IV SOLN: INTRAVENOUS | Status: DC

## 2023-04-08 MED ORDER — PHENYLEPHRINE 80 MCG/ML (10ML) SYRINGE FOR IV PUSH (FOR BLOOD PRESSURE SUPPORT)
PREFILLED_SYRINGE | INTRAVENOUS | Status: AC
  Filled 2023-04-08: qty 10

## 2023-04-08 MED ORDER — PROPOFOL 10 MG/ML IV BOLUS
INTRAVENOUS | Status: DC | PRN
  Administered 2023-04-08 (×3): 50 mg via INTRAVENOUS
  Administered 2023-04-08: 100 mg via INTRAVENOUS

## 2023-04-08 MED ORDER — LIDOCAINE HCL (CARDIAC) PF 100 MG/5ML IV SOSY
PREFILLED_SYRINGE | INTRAVENOUS | Status: DC | PRN
  Administered 2023-04-08: 50 mg via INTRAVENOUS

## 2023-04-08 MED ORDER — EPHEDRINE 5 MG/ML INJ
INTRAVENOUS | Status: AC
  Filled 2023-04-08: qty 5

## 2023-04-08 NOTE — Anesthesia Procedure Notes (Signed)
Date/Time: 04/08/2023 12:36 PM  Performed by: Julian Reil, CRNAPre-anesthesia Checklist: Patient identified, Emergency Drugs available, Patient being monitored and Suction available Patient Re-evaluated:Patient Re-evaluated prior to induction Oxygen Delivery Method: Nasal cannula Induction Type: IV induction Placement Confirmation: positive ETCO2

## 2023-04-08 NOTE — Op Note (Addendum)
[facility] Patient Name: Dana Gray Procedure Date: 04/08/2023 12:28 PM MRN: [id number] Date of Birth: 08-Sep-1955 Attending MD: Hennie Duos. Marletta Lor , [place], [id number] CSN: [id number] Age: 67 Admit Type: Inpatient Procedure:                Colonoscopy Indications:              Screening for colorectal malignant neoplasm Providers:                Hennie Duos. Marletta Lor, DO, Crystal Page, Zena Amos Referring MD:              Medicines:                See the Anesthesia note for documentation of the                            administered medications Complications:            No immediate complications. Estimated Blood Loss:     Estimated blood loss was minimal. Procedure:                Pre-Anesthesia Assessment:                           - The anesthesia plan was to use monitored                            anesthesia care (MAC).                           After obtaining informed consent, the colonoscope                            was passed under direct vision. Throughout the                            procedure, the patient's blood pressure, pulse, and                            oxygen saturations were monitored continuously. The                            [id number]-HQ190L ([id number]) scope was introduced through                            the anus and advanced to the the cecum, identified                            by appendiceal orifice and ileocecal valve. The                            colonoscopy was performed without difficulty. The                            patient tolerated the procedure well. The quality  of the bowel preparation was evaluated using the                            BBPS [facility] Bowel Preparation Scale) with scores                            of: Right Colon = 3, Transverse Colon = 3 and Left                            Colon = 3 (entire mucosa seen well with no residual                            staining, small  fragments of stool or opaque                            liquid). The total BBPS score equals 9. Scope In: 12:38:15 PM Scope Out: 12:56:13 PM Scope Withdrawal Time: 0 hours 15 minutes 56 seconds  Total Procedure Duration: 0 hours 17 minutes 58 seconds  Findings:      Non-bleeding internal hemorrhoids were found during endoscopy.      Multiple large-mouthed and small-mouthed diverticula were found in the       sigmoid colon and descending colon.      Three sessile polyps were found in the cecum. The polyps were 3 to 5 mm       in size. These polyps were removed with a cold snare. Resection and       retrieval were complete.      A 2 mm polyp was found in the cecum. The polyp was sessile. The polyp       was removed with a cold biopsy forceps. Resection and retrieval were       complete.      A 2 mm polyp was found in the transverse colon. The polyp was sessile.       The polyp was removed with a cold biopsy forceps. Resection and       retrieval were complete.      Two sessile polyps were found in the transverse colon. The polyps were 4       to 6 mm in size. These polyps were removed with a cold snare. Resection       and retrieval were complete. Impression:               - Non-bleeding internal hemorrhoids.                           - Diverticulosis in the sigmoid colon and in the                            descending colon.                           - Three 3 to 5 mm polyps in the cecum, removed with                            a cold snare. Resected and retrieved.                           -  One 2 mm polyp in the cecum, removed with a cold                            biopsy forceps. Resected and retrieved.                           - One 2 mm polyp in the transverse colon, removed                            with a cold biopsy forceps. Resected and retrieved.                           - Two 4 to 6 mm polyps in the transverse colon,                            removed with a cold snare.  Resected and retrieved. Moderate Sedation:      Per Anesthesia Care Recommendation:           - Patient has a contact number available for                            emergencies. The signs and symptoms of potential                            delayed complications were discussed with the                            patient. Return to normal activities tomorrow.                            Written discharge instructions were provided to the                            patient.                           - Resume previous diet.                           - Continue present medications.                           - Await pathology results.                           - Repeat colonoscopy in 3 - 5 years for                            surveillance.                           - Return to GI clinic PRN. Procedure Code(s):        --- Professional ---  47829, Colonoscopy, flexible; with removal of                            tumor(s), polyp(s), or other lesion(s) by snare                            technique                           45380, 59, Colonoscopy, flexible; with biopsy,                            single or multiple Diagnosis Code(s):        --- Professional ---                           D12.0, Benign neoplasm of cecum                           D12.3, Benign neoplasm of transverse colon (hepatic                            flexure or splenic flexure)                           Z12.11, Encounter for screening for malignant                            neoplasm of colon                           K64.8, Other hemorrhoids                           K57.30, Diverticulosis of large intestine without                            perforation or abscess without bleeding CPT copyright 2022 American Medical Association. All rights reserved. The codes documented in this report are preliminary and upon coder review may  be revised to meet current compliance requirements. Hennie Duos. Marletta Lor,  DO Hennie Duos. Marletta Lor, DO 04/08/2023 12:59:28 PM This report has been signed electronically. Number of Addenda: 0

## 2023-04-08 NOTE — Discharge Instructions (Addendum)
  Colonoscopy Discharge Instructions  Read the instructions outlined below and refer to this sheet in the next few weeks. These discharge instructions provide you with general information on caring for yourself after you leave the hospital. Your doctor may also give you specific instructions. While your treatment has been planned according to the most current medical practices available, unavoidable complications occasionally occur.   ACTIVITY You may resume your regular activity, but move at a slower pace for the next 24 hours.  Take frequent rest periods for the next 24 hours.  Walking will help get rid of the air and reduce the bloated feeling in your belly (abdomen).  No driving for 24 hours (because of the medicine (anesthesia) used during the test).   Do not sign any important legal documents or operate any machinery for 24 hours (because of the anesthesia used during the test).  NUTRITION Drink plenty of fluids.  You may resume your normal diet as instructed by your doctor.  Begin with a light meal and progress to your normal diet. Heavy or fried foods are harder to digest and may make you feel sick to your stomach (nauseated).  Avoid alcoholic beverages for 24 hours or as instructed.  MEDICATIONS You may resume your normal medications unless your doctor tells you otherwise.  WHAT YOU CAN EXPECT TODAY Some feelings of bloating in the abdomen.  Passage of more gas than usual.  Spotting of blood in your stool or on the toilet paper.  IF YOU HAD POLYPS REMOVED DURING THE COLONOSCOPY: No aspirin products for 7 days or as instructed.  No alcohol for 7 days or as instructed.  Eat a soft diet for the next 24 hours.  FINDING OUT THE RESULTS OF YOUR TEST Not all test results are available during your visit. If your test results are not back during the visit, make an appointment with your caregiver to find out the results. Do not assume everything is normal if you have not heard from your  caregiver or the medical facility. It is important for you to follow up on all of your test results.  SEEK IMMEDIATE MEDICAL ATTENTION IF: You have more than a spotting of blood in your stool.  Your belly is swollen (abdominal distention).  You are nauseated or vomiting.  You have a temperature over 101.  You have abdominal pain or discomfort that is severe or gets worse throughout the day.   Your colonoscopy revealed 7 small polyp(s) which I removed successfully. Await pathology results, my office will contact you. I recommend repeating colonoscopy in 3-5 years for surveillance purposes depending on pathology results  You also have diverticulosis and internal hemorrhoids. I would recommend increasing fiber in your diet or adding OTC Benefiber/Metamucil. Be sure to drink at least 4 to 6 glasses of water daily. Follow-up with GI as needed.   I hope you have a great rest of your week!  Hennie Duos. Marletta Lor, D.O. Gastroenterology and Hepatology [facility]

## 2023-04-08 NOTE — Anesthesia Preprocedure Evaluation (Addendum)
Anesthesia Evaluation  Patient identified by MRN, date of birth, ID band Patient awake    Reviewed: Allergy & Precautions, H&P , NPO status , Patient's Chart, lab work & pertinent test results, reviewed documented beta blocker date and time   Airway Mallampati: II  TM Distance: >3 FB Neck ROM: full    Dental no notable dental hx.    Pulmonary neg pulmonary ROS   Pulmonary exam normal breath sounds clear to auscultation       Cardiovascular Exercise Tolerance: Good hypertension, Normal cardiovascular exam Rhythm:regular Rate:Normal     Neuro/Psych negative neurological ROS  negative psych ROS   GI/Hepatic negative GI ROS, Neg liver ROS,,,  Endo/Other  negative endocrine ROS    Renal/GU negative Renal ROS  negative genitourinary   Musculoskeletal   Abdominal   Peds  Hematology negative hematology ROS (+)   Anesthesia Other Findings   Reproductive/Obstetrics negative OB ROS                              Anesthesia Physical Anesthesia Plan  ASA: 2  Anesthesia Plan: General   Post-op Pain Management: Minimal or no pain anticipated   Induction: Intravenous  PONV Risk Score and Plan: Propofol infusion  Airway Management Planned: Nasal Cannula and Natural Airway  Additional Equipment:   Intra-op Plan:   Post-operative Plan:   Informed Consent: I have reviewed the patients History and Physical, chart, labs and discussed the procedure including the risks, benefits and alternatives for the proposed anesthesia with the patient or authorized representative who has indicated his/her understanding and acceptance.     Dental Advisory Given  Plan Discussed with: CRNA  Anesthesia Plan Comments:         Anesthesia Quick Evaluation

## 2023-04-08 NOTE — Anesthesia Postprocedure Evaluation (Signed)
Anesthesia Post Note  Patient: Dana Gray  Procedure(s) Performed: COLONOSCOPY WITH PROPOFOL POLYPECTOMY  Patient location during evaluation: PACU Anesthesia Type: General Level of consciousness: awake and alert Pain management: pain level controlled Vital Signs Assessment: post-procedure vital signs reviewed and stable Respiratory status: spontaneous breathing, nonlabored ventilation, respiratory function stable and patient connected to nasal cannula oxygen Cardiovascular status: blood pressure returned to baseline and stable Postop Assessment: no apparent nausea or vomiting Anesthetic complications: no   There were no known notable events for this encounter.   Last Vitals:  Vitals:   04/08/23 1026 04/08/23 1259  BP: 129/75 (!) 103/57  Pulse: 74 79  Resp: 15 19  Temp: 36.7 C 36.7 C  SpO2: 99% 97%    Last Pain:  Vitals:   04/08/23 1259  TempSrc:   PainSc: 0-No pain                 Keyshaun Exley L Blu Lori

## 2023-04-08 NOTE — H&P (Signed)
Primary Care Physician:  Gabriel Earing, FNP Primary Gastroenterologist:  Dr. Marletta Lor  Pre-Procedure History & Physical: HPI:  Dana Gray is a 67 y.o. female is here for first ever colonoscopy for colon cancer screening purposes.  Patient denies any family history of colorectal cancer.  No melena or hematochezia.  No abdominal pain or unintentional weight loss.  No change in bowel habits.  Overall feels well from a GI standpoint.  Past Medical History:  Diagnosis Date   Chest pain    HTN (hypertension)    PONV (postoperative nausea and vomiting)     Past Surgical History:  Procedure Laterality Date   ABDOMINAL HYSTERECTOMY     SHOULDER SURGERY     TUBAL LIGATION  1982    Prior to Admission medications   Medication Sig Start Date End Date Taking? Authorizing Provider  alendronate (FOSAMAX) 70 MG tablet Take 1 tablet (70 mg total) by mouth every 7 (seven) days. Take with a full glass of water on an empty stomach. 03/31/23  Yes Gabriel Earing, FNP  cetirizine (ZYRTEC) 10 MG tablet Take 10 mg by mouth daily.   Yes [provider]  fluticasone (FLONASE) 50 MCG/ACT nasal spray Place 2 sprays into both nostrils daily. 03/17/23  Yes Gabriel Earing, FNP  lisinopril-hydrochlorothiazide (ZESTORETIC) 20-12.5 MG tablet Take 1 tablet by mouth daily.   Yes [provider]    Allergies as of 03/25/2023   (No Known Allergies)    Family History  Problem Relation Age of Onset   Hypertension Mother    Arthritis Mother    Emphysema Father    COPD Father    Heart failure Father    Breast cancer Sister     Social History   Socioeconomic History   Marital status: Widowed    Spouse name: Not on file   Number of children: 2   Years of education: Not on file   Highest education level: Not on file  Occupational History   Occupation: retired  Tobacco Use   Smoking status: Never   Smokeless tobacco: Never  Substance and Sexual Activity   Alcohol use: No   Drug  use: No   Sexual activity: Not on file  Other Topics Concern   Not on file  Social History Narrative   Husband passed away 08/10/2021. Children live nearby - son and daughter   Social Determinants of Health   Financial Resource Strain: Low Risk  (12/12/2022)   Overall Financial Resource Strain (CARDIA)    Difficulty of Paying Living Expenses: Not hard at all  Food Insecurity: No Food Insecurity (12/12/2022)   Hunger Vital Sign    Worried About Running Out of Food in the Last Year: Never true    Ran Out of Food in the Last Year: Never true  Transportation Needs: No Transportation Needs (12/12/2022)   PRAPARE - Administrator, Civil Service (Medical): No    Lack of Transportation (Non-Medical): No  Physical Activity: Insufficiently Active (12/12/2022)   Exercise Vital Sign    Days of Exercise per Week: 2 days    Minutes of Exercise per Session: 60 min  Stress: No Stress Concern Present (12/12/2022)   [employer] of Occupational Health - Occupational Stress Questionnaire    Feeling of Stress : Not at all  Social Connections: Moderately Integrated (12/12/2022)   Social Connection and Isolation Panel [NHANES]    Frequency of Communication with Friends and Family: More than three times a week  Frequency of Social Gatherings with Friends and Family: More than three times a week    Attends Religious Services: More than 4 times per year    Active Member of Clubs or Organizations: Yes    Attends Banker Meetings: 1 to 4 times per year    Marital Status: Widowed  Intimate Partner Violence: Not At Risk (12/12/2022)   Humiliation, Afraid, Rape, and Kick questionnaire    Fear of Current or Ex-Partner: No    Emotionally Abused: No    Physically Abused: No    Sexually Abused: No    Review of Systems: See HPI, otherwise negative ROS  Physical Exam: Vital signs in last 24 hours: Temp:  [98.1 F (36.7 C)] 98.1 F (36.7 C) (10/01 1026) Pulse Rate:  [74] 74 (10/01  1026) Resp:  [15] 15 (10/01 1026) BP: (129)/(75) 129/75 (10/01 1026) SpO2:  [99 %] 99 % (10/01 1026) Weight:  [70.3 kg] 70.3 kg (10/01 1026)   General:   Alert,  Well-developed, well-nourished, pleasant and cooperative in NAD Head:  Normocephalic and atraumatic. Eyes:  Sclera clear, no icterus.   Conjunctiva pink. Ears:  Normal auditory acuity. Nose:  No deformity, discharge,  or lesions. Msk:  Symmetrical without gross deformities. Normal posture. Extremities:  Without clubbing or edema. Neurologic:  Alert and  oriented x4;  grossly normal neurologically. Skin:  Intact without significant lesions or rashes. Psych:  Alert and cooperative. Normal mood and affect.  Impression/Plan: Dana Gray is here for a colonoscopy to be performed for colon cancer screening purposes.  The risks of the procedure including infection, bleed, or perforation as well as benefits, limitations, alternatives and imponderables have been reviewed with the patient. Questions have been answered. All parties agreeable.

## 2023-04-08 NOTE — Transfer of Care (Signed)
Immediate Anesthesia Transfer of Care Note  Patient: Dana Gray  Procedure(s) Performed: COLONOSCOPY WITH PROPOFOL POLYPECTOMY  Patient Location: Endoscopy Unit  Anesthesia Type:General  Level of Consciousness: awake  Airway & Oxygen Therapy: Patient Spontanous Breathing  Post-op Assessment: Report given to RN  Post vital signs: Reviewed and stable  Last Vitals:  Vitals Value Taken Time  BP    Temp 36.7 C 04/08/23 1259  Pulse 79 04/08/23 1259  Resp 19 04/08/23 1259  SpO2 97 % 04/08/23 1259    Last Pain:  Vitals:   04/08/23 1233  TempSrc:   PainSc: 0-No pain      Patients Stated Pain Goal: 7 (04/08/23 1026)  Complications: No notable events documented.

## 2023-04-09 LAB — SURGICAL PATHOLOGY

## 2023-04-15 ENCOUNTER — Other Ambulatory Visit: Payer: Self-pay | Admitting: Family Medicine

## 2023-04-17 ENCOUNTER — Encounter (HOSPITAL_COMMUNITY): Payer: Self-pay | Admitting: Internal Medicine

## 2023-09-18 ENCOUNTER — Ambulatory Visit (INDEPENDENT_AMBULATORY_CARE_PROVIDER_SITE_OTHER): Payer: Medicare HMO | Admitting: Family Medicine

## 2023-09-18 ENCOUNTER — Encounter: Payer: Self-pay | Admitting: Family Medicine

## 2023-09-18 VITALS — BP 135/66 | HR 70 | Temp 97.7°F | Ht 63.0 in | Wt 160.2 lb

## 2023-09-18 DIAGNOSIS — I10 Essential (primary) hypertension: Secondary | ICD-10-CM

## 2023-09-18 DIAGNOSIS — R6889 Other general symptoms and signs: Secondary | ICD-10-CM | POA: Diagnosis not present

## 2023-09-18 DIAGNOSIS — E782 Mixed hyperlipidemia: Secondary | ICD-10-CM | POA: Diagnosis not present

## 2023-09-18 MED ORDER — LISINOPRIL 20 MG PO TABS: 30.0000 mg | ORAL_TABLET | Freq: Every day | ORAL | 3 refills | Status: AC

## 2023-09-18 NOTE — Patient Instructions (Signed)
 Prevagen over the counter for memory

## 2023-09-18 NOTE — Progress Notes (Signed)
 Established Patient Office Visit  Subjective   Patient ID: Dana Gray, female    DOB: Oct 22, 1955  Age: 68 y.o. MRN: [id number]  Chief Complaint  Patient presents with   Medical Management of Chronic Issues    HPI  HTN Complaint with meds - Yes Current Medications - lisinopril-HCTZ Exercising Regularly - No Pertinent ROS:  Visual Disturbances - No Chest pain - No Dyspnea - No Palpitations - No LE edema - No  2. Forgetfulness Dana Gray is concerned about her memory. She has noticed increased forgetfulness. For example, she will place a book down that she is reading to use the bathroom then will forget where she placed the book. She also forgot to close her front door the other night when she went to bed. There is a family hx of dementia and this worries her. She doesn't sleep well over all. Some nights she sleeps 7-8 hours, but not most. She does have to get up to use the restroom at least 2x a night. Often she is able to get back to sleep after getting up. She would like to stop hydrochlorothiazide to see if this would improve.      09/18/2023    8:15 AM  MMSE - Mini Mental State Exam  Orientation to time 5  Orientation to Place 5  Registration 3  Attention/ Calculation 5  Recall 2  Language- name 2 objects 2  Language- repeat 1  Language- follow 3 step command 3  Language- read & follow direction 1  Write a sentence 1  Copy design 1  Total score 29    Past Medical History:  Diagnosis Date   Chest pain    HTN (hypertension)    PONV (postoperative nausea and vomiting)       ROS As per HPI.    Objective:     BP 135/66   Pulse 70   Temp 97.7 F (36.5 C) (Temporal)   Ht 5\' 3"  (1.6 m)   Wt 160 lb 3.2 oz (72.7 kg)   SpO2 98%   BMI 28.38 kg/m    Physical Exam Vitals and nursing note reviewed.  Constitutional:      General: She is not in acute distress.    Appearance: Normal appearance. She is not ill-appearing, toxic-appearing or diaphoretic.   Cardiovascular:     Rate and Rhythm: Normal rate and regular rhythm.     Heart sounds: Normal heart sounds. No murmur heard. Pulmonary:     Effort: Pulmonary effort is normal. No respiratory distress.     Breath sounds: Normal breath sounds. No wheezing, rhonchi or rales.  Chest:     Chest wall: No tenderness.  Skin:    General: Skin is warm and dry.  Neurological:     General: No focal deficit present.     Mental Status: She is alert and oriented to person, place, and time.  Psychiatric:        Mood and Affect: Mood normal.        Behavior: Behavior normal.      No results found for any visits on 09/18/23.    The 10-year ASCVD risk score (Arnett DK, et al., 2019) is: 12.7%    Assessment & Plan:   Princess was seen today for medical management of chronic issues.  Diagnoses and all orders for this visit:  Primary hypertension Well controlled on current regimen but would like trial off hydrochlorothiazide to see if nocturia improves. Will try lisinopril 30 mg daily and  discontinue hydrochlorothiazide. Monitor BP at home and notify for high or low readings.  -     lisinopril (ZESTRIL) 20 MG tablet; Take 1.5 tablets (30 mg total) by mouth daily.  Mixed hyperlipidemia Last LDL 86. Diet, exercise, weight loss.   Forgetfulness Normal MMSE. Discussed sleep hygiene to improve sleep to improve forgetfulness.     Return in about 6 months (around 03/20/2024) for CPE.   The patient indicates understanding of these issues and agrees with the plan.  Gabriel Earing, FNP

## 2023-12-15 ENCOUNTER — Ambulatory Visit (INDEPENDENT_AMBULATORY_CARE_PROVIDER_SITE_OTHER): Payer: Medicare HMO

## 2023-12-15 VITALS — BP 135/66 | HR 70 | Ht 63.0 in | Wt 160.0 lb

## 2023-12-15 DIAGNOSIS — Z0000 Encounter for general adult medical examination without abnormal findings: Secondary | ICD-10-CM

## 2023-12-15 DIAGNOSIS — Z1231 Encounter for screening mammogram for malignant neoplasm of breast: Secondary | ICD-10-CM

## 2023-12-15 NOTE — Progress Notes (Signed)
 Subjective:   Dana Gray is a 68 y.o. who presents for a Medicare Wellness preventive visit.  As a reminder, Annual Wellness Visits don't include a physical exam, and some assessments may be limited, especially if this visit is performed virtually. We may recommend an in-person follow-up visit with your provider if needed.  Visit Complete: Virtual I connected with  Dana Gray on 12/15/23 by a audio enabled telemedicine application and verified that I am speaking with the correct person using two identifiers.  Patient Location: Home  Provider Location: Home Office  I discussed the limitations of evaluation and management by telemedicine. The patient expressed understanding and agreed to proceed.  Vital Signs: Because this visit was a virtual/telehealth visit, some criteria may be missing or patient reported. Any vitals not documented were not able to be obtained and vitals that have been documented are patient reported.  VideoDeclined- This patient declined Librarian, academic. Therefore the visit was completed with audio only.  Persons Participating in Visit: Patient.  AWV Questionnaire: No: Patient Medicare AWV questionnaire was not completed prior to this visit.  Cardiac Risk Factors include: advanced age (>63men, >44 women);dyslipidemia;hypertension     Objective:     Today's Vitals   12/15/23 0804  BP: 135/66  Pulse: 70  Weight: 160 lb (72.6 kg)  Height: 5\' 3"  (1.6 m)   Body mass index is 28.34 kg/m.     12/15/2023    8:11 AM 04/08/2023   10:14 AM 12/12/2022    8:20 AM 12/07/2021    9:10 AM 12/06/2020    9:29 AM  Advanced Directives  Does Patient Have a Medical Advance Directive? No No Yes No No  Type of Best boy of [place];Living will    Copy of Healthcare Power of Attorney in Chart?   No - copy requested    Would patient like information on creating a medical advance directive?  No - Patient declined   No - Patient declined Yes (MAU/Ambulatory/Procedural Areas - Information given)    Current Medications (verified) Outpatient Encounter Medications as of 12/15/2023  Medication Sig   alendronate  (FOSAMAX ) 70 MG tablet Take 1 tablet (70 mg total) by mouth every 7 (seven) days. Take with a full glass of water on an empty stomach.   cetirizine (ZYRTEC) 10 MG tablet Take 10 mg by mouth daily.   fluticasone  (FLONASE ) 50 MCG/ACT nasal spray Place 2 sprays into both nostrils daily.   lisinopril  (ZESTRIL ) 20 MG tablet Take 1.5 tablets (30 mg total) by mouth daily.   No facility-administered encounter medications on file as of 12/15/2023.    Allergies (verified) Patient has no known allergies.   History: Past Medical History:  Diagnosis Date   Chest pain    HTN (hypertension)    PONV (postoperative nausea and vomiting)    Past Surgical History:  Procedure Laterality Date   ABDOMINAL HYSTERECTOMY     COLONOSCOPY WITH PROPOFOL  N/A 04/08/2023   Procedure: COLONOSCOPY WITH PROPOFOL ;  Surgeon: Vinetta Greening, DO;  Location: [facility];  Service: Endoscopy;  Laterality: N/A;  11:00am, asa 2   POLYPECTOMY  04/08/2023   Procedure: POLYPECTOMY;  Surgeon: Vinetta Greening, DO;  Location: [facility];  Service: Endoscopy;;  cold snare/cold biopsy   SHOULDER SURGERY     TUBAL LIGATION  1982   Family History  Problem Relation Age of Onset   Hypertension Mother    Arthritis Mother    Emphysema Father  COPD Father    Heart failure Father    Breast cancer Sister    Social History   Socioeconomic History   Marital status: Widowed    Spouse name: Not on file   Number of children: 2   Years of education: Not on file   Highest education level: Not on file  Occupational History   Occupation: retired  Tobacco Use   Smoking status: Never   Smokeless tobacco: Never  Substance and Sexual Activity   Alcohol use: No   Drug use: No   Sexual activity: Not on file  Other Topics Concern    Not on file  Social History Narrative   Husband passed away 08-Aug-2021. Children live nearby - son and daughter   Social Drivers of Corporate investment banker Strain: Low Risk  (12/15/2023)   Overall Financial Resource Strain (CARDIA)    Difficulty of Paying Living Expenses: Not hard at all  Food Insecurity: No Food Insecurity (12/15/2023)   Hunger Vital Sign    Worried About Running Out of Food in the Last Year: Never true    Ran Out of Food in the Last Year: Never true  Transportation Needs: No Transportation Needs (12/15/2023)   PRAPARE - Administrator, Civil Service (Medical): No    Lack of Transportation (Non-Medical): No  Physical Activity: Insufficiently Active (12/15/2023)   Exercise Vital Sign    Days of Exercise per Week: 2 days    Minutes of Exercise per Session: 50 min  Stress: No Stress Concern Present (12/15/2023)   [employer] of Occupational Health - Occupational Stress Questionnaire    Feeling of Stress : Not at all  Social Connections: Socially Isolated (12/15/2023)   Social Connection and Isolation Panel [NHANES]    Frequency of Communication with Friends and Family: Three times a week    Frequency of Social Gatherings with Friends and Family: Three times a week    Attends Religious Services: Never    Active Member of Clubs or Organizations: No    Attends Banker Meetings: Never    Marital Status: Widowed    Tobacco Counseling Counseling given: Yes    Clinical Intake:  Pre-visit preparation completed: Yes  Pain : No/denies pain     BMI - recorded: 28.34 Nutritional Status: BMI 25 -29 Overweight Nutritional Risks: None Diabetes: No  No results found for: "HGBA1C"   How often do you need to have someone help you when you read instructions, pamphlets, or other written materials from your doctor or pharmacy?: 1 - Never  Interpreter Needed?: No  Information entered by :: alia T/cma   Activities of Daily Living      12/15/2023    8:08 AM  In your present state of health, do you have any difficulty performing the following activities:  Hearing? 0  Vision? 0  Difficulty concentrating or making decisions? 0  Walking or climbing stairs? 0  Dressing or bathing? 0  Doing errands, shopping? 0  Preparing Food and eating ? N  Using the Toilet? N  In the past six months, have you accidently leaked urine? N  Do you have problems with loss of bowel control? N  Managing your Medications? N  Managing your Finances? N  Housekeeping or managing your Housekeeping? N    Patient Care Team: Albertha Huger, FNP as PCP - General (Family Medicine)  I have updated your Care Teams any recent Medical Services you may have received from other providers  in the past year.     Assessment:    This is a routine wellness examination for Dana Gray.  Hearing/Vision screen Hearing Screening - Comments:: Pt wears hearing  Vision Screening - Comments:: Pt wear readers for reading/Pt goes to [facility]. Audry Blinks app was 2-3 yrs ago. Suggest to have eyes checked per pt aware   Goals Addressed             This Visit's Progress    Patient Stated       Loose wt and be active like she use to       Depression Screen     12/15/2023    8:15 AM 09/18/2023    8:23 AM 03/17/2023    2:11 PM 12/12/2022    8:19 AM 12/07/2021    9:03 AM 11/28/2021    9:02 AM 11/14/2021    8:57 AM  PHQ 2/9 Scores  PHQ - 2 Score 0  0 0 2 2 2   PHQ- 9 Score 0  3  7 8 5   Exception Documentation  Patient refusal         Fall Risk     12/15/2023    8:06 AM 09/18/2023    8:23 AM 03/17/2023    2:12 PM 12/12/2022    8:18 AM 12/07/2021    9:00 AM  Fall Risk   Falls in the past year? 0 0 0 0 0  Number falls in past yr: 0   0 0  Injury with Fall? 0   0 0  Risk for fall due to : No Fall Risks   No Fall Risks No Fall Risks  Follow up Falls evaluation completed   Falls prevention discussed Falls prevention discussed    MEDICARE RISK AT HOME:  Medicare Risk at  Home Any stairs in or around the home?: Yes If so, are there any without handrails?: Yes Home free of loose throw rugs in walkways, pet beds, electrical cords, etc?: Yes Adequate lighting in your home to reduce risk of falls?: Yes Life alert?: No Use of a cane, walker or w/c?: No Grab bars in the bathroom?: Yes Shower chair or bench in shower?: Yes Elevated toilet seat or a handicapped toilet?: No  TIMED UP AND GO:  Was the test performed?  no  Cognitive Function: 6CIT completed    09/18/2023    8:15 AM  MMSE - Mini Mental State Exam  Orientation to time 5  Orientation to Place 5  Registration 3  Attention/ Calculation 5  Recall 2  Language- name 2 objects 2  Language- repeat 1  Language- follow 3 step command 3  Language- read & follow direction 1  Write a sentence 1  Copy design 1  Total score 29        12/15/2023    8:17 AM 12/12/2022    8:21 AM 12/07/2021    9:05 AM 12/06/2020    9:31 AM  6CIT Screen  What Year? 0 points 0 points 0 points 0 points  What month? 0 points 0 points 0 points 0 points  What time? 0 points 0 points 0 points 0 points  Count back from 20 0 points 0 points 0 points 0 points  Months in reverse 0 points 0 points 0 points 0 points  Repeat phrase 0 points 0 points 0 points 4 points  Total Score 0 points 0 points 0 points 4 points    Immunizations Immunization History  Administered Date(s) Administered   Td 02/15/2014  Tdap 02/15/2014    Screening Tests Health Maintenance  Topic Date Due   MAMMOGRAM  12/04/2023   Pneumonia Vaccine 44+ Years old (1 of 1 - PCV) 03/16/2024 (Originally 09/14/2003)   COVID-19 Vaccine (1 - 2024-25 season) 04/01/2024 (Originally 03/09/2023)   Zoster Vaccines- Shingrix (1 of 2) 02/14/2026 (Originally 09/14/2003)   INFLUENZA VACCINE  02/06/2024   DTaP/Tdap/Td (3 - Td or Tdap) 02/16/2024   Medicare Annual Wellness (AWV)  12/14/2024   DEXA SCAN  03/17/2025   Colonoscopy  04/07/2033   Hepatitis C Screening  Completed    HPV VACCINES  Aged Out   Meningococcal B Vaccine  Aged Out    Health Maintenance  Health Maintenance Due  Topic Date Due   MAMMOGRAM  12/04/2023   Health Maintenance Items Addressed: Mammogram ordered  Additional Screening:  Vision Screening: Recommended annual ophthalmology exams for early detection of glaucoma and other disorders of the eye. Would you like a referral to an eye doctor? No    Dental Screening: Recommended annual dental exams for proper oral hygiene  Community Resource Referral / Chronic Care Management: CRR required this visit?  No   CCM required this visit?  No   Plan:    I have personally reviewed and noted the following in the patient's chart:   Medical and social history Use of alcohol, tobacco or illicit drugs  Current medications and supplements including opioid prescriptions. Patient is not currently taking opioid prescriptions. Functional ability and status Nutritional status Physical activity Advanced directives List of other physicians Hospitalizations, surgeries, and ER visits in previous 12 months Vitals Screenings to include cognitive, depression, and falls Referrals and appointments  In addition, I have reviewed and discussed with patient certain preventive protocols, quality metrics, and best practice recommendations. A written personalized care plan for preventive services as well as general preventive health recommendations were provided to patient.   Michaelle Adolphus, CMA   12/15/2023   After Visit Summary: (MyChart) Due to this being a telephonic visit, the after visit summary with patients personalized plan was offered to patient via MyChart   Notes: Nothing significant to report at this time.

## 2023-12-15 NOTE — Patient Instructions (Signed)
 Dana Gray , Thank you for taking time out of your busy schedule to complete your Annual Wellness Visit with me. I enjoyed our conversation and look forward to speaking with you again next year. I, as well as your care team,  appreciate your ongoing commitment to your health goals. Please review the following plan we discussed and let me know if I can assist you in the future. Your Game plan/ To Do List    Follow up Visits: Next Medicare AWV with our clinical staff: 12/15/24 at 8:00a.m.   Next Office Visit with your provider: 03/29/24 at 8:00a.m.  Clinician Recommendations:  Aim for 30 minutes of exercise or brisk walking, 6-8 glasses of water, and 5 servings of fruits and vegetables each day. N/a      This is a list of the screening recommended for you and due dates:  Health Maintenance  Topic Date Due   Mammogram  12/04/2023   Pneumonia Vaccine (1 of 1 - PCV) 03/16/2024*   COVID-19 Vaccine (1 - 2024-25 season) 04/01/2024*   Zoster (Shingles) Vaccine (1 of 2) 02/14/2026*   Flu Shot  02/06/2024   DTaP/Tdap/Td vaccine (3 - Td or Tdap) 02/16/2024   Medicare Annual Wellness Visit  12/14/2024   DEXA scan (bone density measurement)  03/17/2025   Colon Cancer Screening  04/07/2033   Hepatitis C Screening  Completed   HPV Vaccine  Aged Out   Meningitis B Vaccine  Aged Out  *Topic was postponed. The date shown is not the original due date.    Advanced directives: (Declined) Advance directive discussed with you today. Even though you declined this today, please call our office should you change your mind, and we can give you the proper paperwork for you to fill out. Advance Care Planning is important because it:  [x]  Makes sure you receive the medical care that is consistent with your values, goals, and preferences  [x]  It provides guidance to your family and loved ones and reduces their decisional burden about whether or not they are making the right decisions based on your wishes.  Follow  the link provided in your after visit summary or read over the paperwork we have mailed to you to help you started getting your Advance Directives in place. If you need assistance in completing these, please reach out to us  so that we can help you!  See attachments for Preventive Care and Fall Prevention Tips.

## 2023-12-31 ENCOUNTER — Encounter

## 2024-01-19 ENCOUNTER — Ambulatory Visit
Admission: RE | Admit: 2024-01-19 | Discharge: 2024-01-19 | Disposition: A | Discharge status: Home / Self Care | Source: Ambulatory Visit | Attending: Family Medicine

## 2024-01-19 DIAGNOSIS — Z1231 Encounter for screening mammogram for malignant neoplasm of breast: Secondary | ICD-10-CM

## 2024-01-19 DIAGNOSIS — Z0000 Encounter for general adult medical examination without abnormal findings: Secondary | ICD-10-CM

## 2024-03-03 ENCOUNTER — Other Ambulatory Visit: Payer: Self-pay | Admitting: Family Medicine

## 2024-03-03 DIAGNOSIS — M810 Age-related osteoporosis without current pathological fracture: Secondary | ICD-10-CM

## 2024-03-15 DIAGNOSIS — H90A32 Mixed conductive and sensorineural hearing loss, unilateral, left ear with restricted hearing on the contralateral side: Secondary | ICD-10-CM | POA: Diagnosis not present

## 2024-03-15 DIAGNOSIS — H90A21 Sensorineural hearing loss, unilateral, right ear, with restricted hearing on the contralateral side: Secondary | ICD-10-CM | POA: Diagnosis not present

## 2024-03-29 ENCOUNTER — Ambulatory Visit (INDEPENDENT_AMBULATORY_CARE_PROVIDER_SITE_OTHER): Admitting: Family Medicine

## 2024-03-29 ENCOUNTER — Encounter: Payer: Self-pay | Admitting: Family Medicine

## 2024-03-29 VITALS — BP 133/77 | HR 98 | Temp 97.8°F | Ht 63.0 in | Wt 161.0 lb

## 2024-03-29 DIAGNOSIS — E559 Vitamin D deficiency, unspecified: Secondary | ICD-10-CM

## 2024-03-29 DIAGNOSIS — I10 Essential (primary) hypertension: Secondary | ICD-10-CM | POA: Diagnosis not present

## 2024-03-29 DIAGNOSIS — Z23 Encounter for immunization: Secondary | ICD-10-CM

## 2024-03-29 DIAGNOSIS — Z0001 Encounter for general adult medical examination with abnormal findings: Secondary | ICD-10-CM | POA: Diagnosis not present

## 2024-03-29 DIAGNOSIS — E782 Mixed hyperlipidemia: Secondary | ICD-10-CM | POA: Diagnosis not present

## 2024-03-29 DIAGNOSIS — Z0000 Encounter for general adult medical examination without abnormal findings: Secondary | ICD-10-CM

## 2024-03-29 DIAGNOSIS — J301 Allergic rhinitis due to pollen: Secondary | ICD-10-CM

## 2024-03-29 MED ORDER — LEVOCETIRIZINE DIHYDROCHLORIDE 5 MG PO TABS: 5.0000 mg | ORAL_TABLET | Freq: Every evening | ORAL | 3 refills | Status: AC

## 2024-03-29 NOTE — Progress Notes (Signed)
 Complete physical exam  Patient: Dana Gray   DOB: [birth year]   68 y.o. Female  MRN: [id number]  Subjective:    Chief Complaint  Patient presents with   Annual Exam    Dana Gray is a 68 y.o. female who presents today for a complete physical exam. She reports consuming a general diet. She has been doing some you tube videos with chair exercises. She generally feels well. She reports sleeping fairly well. She does have additional problems to discuss today.   She has been taking zyrtec for allergic rhinitis. She continues to have a runny noise regularly. She would like to change antihistamines.   She has been compliant with lisinopril  and fosamax .   Most recent fall risk assessment:    03/29/2024    7:59 AM  Fall Risk   Falls in the past year? 0     Most recent depression screenings:    03/29/2024    7:59 AM 12/15/2023    8:15 AM  PHQ 2/9 Scores  PHQ - 2 Score 0 0  PHQ- 9 Score 2 0        Patient Care Team: Joesph Annabella HERO, FNP as PCP - General (Family Medicine)   Outpatient Medications Prior to Visit  Medication Sig   alendronate  (FOSAMAX ) 70 MG tablet TAKE ONE TABLET ONCE WEEKLY ON AN EMPTY STOMACH   cetirizine (ZYRTEC) 10 MG tablet Take 10 mg by mouth daily.   fluticasone  (FLONASE ) 50 MCG/ACT nasal spray Place 2 sprays into both nostrils daily.   lisinopril  (ZESTRIL ) 20 MG tablet Take 1.5 tablets (30 mg total) by mouth daily.   No facility-administered medications prior to visit.    ROS Negative unless specially indicated above in HPI.     Objective:     BP 133/77   Pulse 98   Temp 97.8 F (36.6 C) (Temporal)   Ht 5' 3 (1.6 m)   Wt 161 lb (73 kg)   SpO2 98%   BMI 28.52 kg/m  Wt Readings from Last 3 Encounters:  03/29/24 161 lb (73 kg)  12/15/23 160 lb (72.6 kg)  09/18/23 160 lb 3.2 oz (72.7 kg)      Physical Exam Vitals and nursing note reviewed.  Constitutional:      General: She is not in acute distress.    Appearance: Normal  appearance. She is not ill-appearing, toxic-appearing or diaphoretic.  HENT:     Head: Normocephalic.     Right Ear: Tympanic membrane, ear canal and external ear normal.     Left Ear: Tympanic membrane, ear canal and external ear normal.     Nose: Nose normal.     Mouth/Throat:     Mouth: Mucous membranes are moist.     Pharynx: Oropharynx is clear.  Eyes:     Extraocular Movements: Extraocular movements intact.     Conjunctiva/sclera: Conjunctivae normal.     Pupils: Pupils are equal, round, and reactive to light.  Neck:     Thyroid : No thyroid  mass, thyromegaly or thyroid  tenderness.  Cardiovascular:     Rate and Rhythm: Normal rate and regular rhythm.     Pulses: Normal pulses.     Heart sounds: Normal heart sounds. No murmur heard.    No friction rub. No gallop.  Pulmonary:     Effort: Pulmonary effort is normal.     Breath sounds: Normal breath sounds.  Abdominal:     General: Bowel sounds are normal. There is no distension.  Palpations: Abdomen is soft. There is no mass.     Tenderness: There is no abdominal tenderness. There is no guarding.  Musculoskeletal:     Cervical back: Normal range of motion and neck supple. No tenderness.     Right lower leg: Edema present.     Left lower leg: Edema present.  Skin:    General: Skin is warm and dry.     Capillary Refill: Capillary refill takes less than 2 seconds.     Findings: No lesion or rash.  Neurological:     General: No focal deficit present.     Mental Status: She is alert and oriented to person, place, and time.     Cranial Nerves: No cranial nerve deficit.     Motor: No weakness.     Gait: Gait normal.  Psychiatric:        Mood and Affect: Mood normal.        Behavior: Behavior normal.        Thought Content: Thought content normal.        Judgment: Judgment normal.      No results found for any visits on 03/29/24.     Assessment & Plan:    Routine Health Maintenance and Physical Exam  Dana Gray was  seen today for annual exam.  Diagnoses and all orders for this visit:  Routine general medical examination at a health care facility  Primary hypertension Well controlled on current regimen. Discussed diet and exercise.  -     CBC with Differential/Platelet -     CMP14+EGFR -     TSH  Mixed hyperlipidemia Fasting lipid panel pending.  -     Lipid panel  Vitamin D  deficiency -     VITAMIN D  25 Hydroxy (Vit-D Deficiency, Fractures)  Seasonal allergic rhinitis due to pollen Uncontrolled with zyrtec. Switch to xyzal . Return to office for new or worsening symptoms, or if symptoms persist.  -     levocetirizine (XYZAL ) 5 MG tablet; Take 1 tablet (5 mg total) by mouth every evening.  Need for vaccination -     Tdap vaccine greater than or equal to 7yo IM    Immunization History  Administered Date(s) Administered   Td 02/15/2014   Tdap 02/15/2014, 03/29/2024    Health Maintenance  Topic Date Due   Influenza Vaccine  10/05/2024 (Originally 02/06/2024)   Pneumococcal Vaccine: 50+ Years (1 of 1 - PCV) 03/29/2025 (Originally 09/14/2003)   COVID-19 Vaccine (1 - 2024-25 season) 04/14/2025 (Originally 03/08/2024)   Zoster Vaccines- Shingrix (1 of 2) 02/14/2026 (Originally 09/14/2003)   Mammogram  01/18/2025   Medicare Annual Wellness (AWV)  01/18/2025   DEXA SCAN  03/17/2025   Colonoscopy  04/07/2033   DTaP/Tdap/Td (4 - Td or Tdap) 03/29/2034   Hepatitis C Screening  Completed   HPV VACCINES  Aged Out   Meningococcal B Vaccine  Aged Out    Discussed health benefits of physical activity, and encouraged her to engage in regular exercise appropriate for her age and condition.  Problem List Items Addressed This Visit       Cardiovascular and Mediastinum   Primary hypertension   Relevant Orders   CBC with Differential/Platelet   CMP14+EGFR   TSH     Other   Hyperlipidemia   Relevant Orders   Lipid panel   Vitamin D  deficiency   Relevant Orders   VITAMIN D  25 Hydroxy (Vit-D  Deficiency, Fractures)   Other Visit Diagnoses  Routine general medical examination at a health care facility    -  Primary     Seasonal allergic rhinitis due to pollen       Relevant Medications   levocetirizine (XYZAL ) 5 MG tablet     Need for vaccination       Relevant Orders   Tdap vaccine greater than or equal to 7yo IM (Completed)      Return in about 6 months (around 09/26/2024) for chronic follow up.   The patient indicates understanding of these issues and agrees with the plan.  Annabella CHRISTELLA Search, FNP

## 2024-03-29 NOTE — Patient Instructions (Signed)

## 2024-03-30 LAB — LIPID PANEL
Chol/HDL Ratio: 2.7 ratio (ref 0.0–4.4)
Cholesterol, Total: 195 mg/dL (ref 100–199)
HDL: 73 mg/dL (ref >39)
LDL Chol Calc (NIH): 110 mg/dL — ABNORMAL HIGH (ref 0–99)
Triglycerides: 66 mg/dL (ref 0–149)
VLDL Cholesterol Cal: 12 mg/dL (ref 5–40)

## 2024-03-30 LAB — CBC WITH DIFFERENTIAL/PLATELET
Basophils Absolute: 0.1 x10E3/uL (ref 0.0–0.2)
Basos: 1 %
EOS (ABSOLUTE): 0.4 x10E3/uL (ref 0.0–0.4)
Eos: 6 %
Hematocrit: 39.7 % (ref 34.0–46.6)
Hemoglobin: 12.7 g/dL (ref 11.1–15.9)
Immature Grans (Abs): 0 x10E3/uL (ref 0.0–0.1)
Immature Granulocytes: 0 %
Lymphocytes Absolute: 1.4 x10E3/uL (ref 0.7–3.1)
Lymphs: 22 %
MCH: 27.9 pg (ref 26.6–33.0)
MCHC: 32 g/dL (ref 31.5–35.7)
MCV: 87 fL (ref 79–97)
Monocytes Absolute: 0.6 x10E3/uL (ref 0.1–0.9)
Monocytes: 9 %
Neutrophils Absolute: 3.9 x10E3/uL (ref 1.4–7.0)
Neutrophils: 62 %
Platelets: 380 x10E3/uL (ref 150–450)
RBC: 4.56 x10E6/uL (ref 3.77–5.28)
RDW: 12.5 % (ref 11.7–15.4)
WBC: 6.4 x10E3/uL (ref 3.4–10.8)

## 2024-03-30 LAB — CMP14+EGFR
ALT: 14 IU/L (ref 0–32)
AST: 21 IU/L (ref 0–40)
Albumin: 4.3 g/dL (ref 3.9–4.9)
Alkaline Phosphatase: 104 IU/L (ref 49–135)
BUN/Creatinine Ratio: 22 (ref 12–28)
BUN: 20 mg/dL (ref 8–27)
Bilirubin Total: 0.3 mg/dL (ref 0.0–1.2)
CO2: 22 mmol/L (ref 20–29)
Calcium: 9.3 mg/dL (ref 8.7–10.3)
Chloride: 103 mmol/L (ref 96–106)
Creatinine, Ser: 0.91 mg/dL (ref 0.57–1.00)
Globulin, Total: 2.7 g/dL (ref 1.5–4.5)
Glucose: 99 mg/dL (ref 70–99)
Potassium: 4.6 mmol/L (ref 3.5–5.2)
Sodium: 139 mmol/L (ref 134–144)
Total Protein: 7 g/dL (ref 6.0–8.5)
eGFR: 68 mL/min/1.73 (ref >59)

## 2024-03-30 LAB — VITAMIN D 25 HYDROXY (VIT D DEFICIENCY, FRACTURES): Vit D, 25-Hydroxy: 24.3 ng/mL — AB (ref 30.0–100.0)

## 2024-03-30 LAB — TSH: TSH: 2.17 u[IU]/mL (ref 0.450–4.500)

## 2024-03-31 ENCOUNTER — Ambulatory Visit: Payer: Self-pay | Admitting: Family Medicine

## 2024-03-31 DIAGNOSIS — E559 Vitamin D deficiency, unspecified: Secondary | ICD-10-CM

## 2024-03-31 DIAGNOSIS — E782 Mixed hyperlipidemia: Secondary | ICD-10-CM

## 2024-03-31 MED ORDER — VITAMIN D (ERGOCALCIFEROL) 1.25 MG (50000 UNIT) PO CAPS: 50000.0000 [IU] | ORAL_CAPSULE | ORAL | 0 refills | Status: AC

## 2024-03-31 MED ORDER — ROSUVASTATIN CALCIUM 10 MG PO TABS: 10.0000 mg | ORAL_TABLET | Freq: Every day | ORAL | 3 refills | Status: AC

## 2024-09-27 ENCOUNTER — Ambulatory Visit: Payer: Self-pay | Admitting: Family Medicine
